# Patient Record
Sex: Female | Born: 2005
Health system: Southern US, Community
[De-identification: ages and names within clinical notes are randomized; demographics above are authoritative.]

## PROBLEM LIST (undated history)

## (undated) DIAGNOSIS — F909 Attention-deficit hyperactivity disorder, unspecified type: Secondary | ICD-10-CM

---
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
## 2024-01-16 ENCOUNTER — Other Ambulatory Visit (HOSPITAL_COMMUNITY): Payer: Self-pay

## 2024-01-17 ENCOUNTER — Emergency Department (HOSPITAL_COMMUNITY)
Admission: EM | Admit: 2024-01-17 | Discharge: 2024-01-18 | Disposition: A | Discharge status: Other Acute Inpatient Hospital | Payer: Commercial Managed Care - PPO | Attending: Emergency Medicine | Admitting: Emergency Medicine

## 2024-01-17 ENCOUNTER — Other Ambulatory Visit: Payer: Self-pay

## 2024-01-17 DIAGNOSIS — X789XXA Intentional self-harm by unspecified sharp object, initial encounter: Secondary | ICD-10-CM | POA: Diagnosis not present

## 2024-01-17 DIAGNOSIS — F901 Attention-deficit hyperactivity disorder, predominantly hyperactive type: Secondary | ICD-10-CM | POA: Diagnosis present

## 2024-01-17 DIAGNOSIS — S70312A Abrasion, left thigh, initial encounter: Secondary | ICD-10-CM | POA: Insufficient documentation

## 2024-01-17 DIAGNOSIS — F439 Reaction to severe stress, unspecified: Secondary | ICD-10-CM | POA: Diagnosis not present

## 2024-01-17 DIAGNOSIS — F332 Major depressive disorder, recurrent severe without psychotic features: Secondary | ICD-10-CM | POA: Diagnosis present

## 2024-01-17 DIAGNOSIS — S79922A Unspecified injury of left thigh, initial encounter: Secondary | ICD-10-CM | POA: Diagnosis present

## 2024-01-17 DIAGNOSIS — F909 Attention-deficit hyperactivity disorder, unspecified type: Secondary | ICD-10-CM | POA: Diagnosis not present

## 2024-01-17 LAB — CBC WITH DIFFERENTIAL/PLATELET
Abs Immature Granulocytes: 0.04 10*3/uL (ref 0.00–0.07)
Basophils Absolute: 0.1 10*3/uL (ref 0.0–0.1)
Basophils Relative: 1 %
Eosinophils Absolute: 0.1 10*3/uL (ref 0.0–0.5)
Eosinophils Relative: 1 %
HCT: 39.4 % (ref 36.0–46.0)
Hemoglobin: 12.9 g/dL (ref 12.0–15.0)
Immature Granulocytes: 0 %
Lymphocytes Relative: 30 %
Lymphs Abs: 3 10*3/uL (ref 0.7–4.0)
MCH: 29.9 pg (ref 26.0–34.0)
MCHC: 32.7 g/dL (ref 30.0–36.0)
MCV: 91.2 fL (ref 80.0–100.0)
Monocytes Absolute: 0.7 10*3/uL (ref 0.1–1.0)
Monocytes Relative: 7 %
Neutro Abs: 6.2 10*3/uL (ref 1.7–7.7)
Neutrophils Relative %: 61 %
Platelets: 337 10*3/uL (ref 150–400)
RBC: 4.32 MIL/uL (ref 3.87–5.11)
RDW: 12.6 % (ref 11.5–15.5)
WBC: 10.1 10*3/uL (ref 4.0–10.5)
nRBC: 0 % (ref 0.0–0.2)

## 2024-01-17 LAB — COMPREHENSIVE METABOLIC PANEL
ALT: 28 U/L (ref 0–44)
AST: 23 U/L (ref 15–41)
Albumin: 4.1 g/dL (ref 3.5–5.0)
Alkaline Phosphatase: 56 U/L (ref 38–126)
Anion gap: 10 (ref 5–15)
BUN: 14 mg/dL (ref 6–20)
CO2: 23 mmol/L (ref 22–32)
Calcium: 9.6 mg/dL (ref 8.9–10.3)
Chloride: 104 mmol/L (ref 98–111)
Creatinine, Ser: 0.64 mg/dL (ref 0.44–1.00)
GFR, Estimated: >60 mL/min (ref >60)
Glucose, Bld: 121 mg/dL — ABNORMAL HIGH (ref 70–99)
Potassium: 3.6 mmol/L (ref 3.5–5.1)
Sodium: 137 mmol/L (ref 135–145)
Total Bilirubin: 0.5 mg/dL (ref 0.0–1.2)
Total Protein: 7.6 g/dL (ref 6.5–8.1)

## 2024-01-17 LAB — RAPID URINE DRUG SCREEN, HOSP PERFORMED
Amphetamines: NOT DETECTED
Barbiturates: NOT DETECTED
Benzodiazepines: NOT DETECTED
Cocaine: NOT DETECTED
Opiates: NOT DETECTED
Tetrahydrocannabinol: NOT DETECTED

## 2024-01-17 LAB — ETHANOL: Alcohol, Ethyl (B): <10 mg/dL (ref <10)

## 2024-01-17 LAB — HCG, SERUM, QUALITATIVE: Preg, Serum: NEGATIVE

## 2024-01-17 NOTE — ED Provider Notes (Signed)
[place] EMERGENCY DEPARTMENT AT [facility] Provider Note   CSN: [id number] Arrival date & time: 01/17/24  1535     History {Add pertinent medical, surgical, social history, OB history to HPI:1} Chief Complaint  Patient presents with   Psychiatric Evaluation    Betty Davis is a 18 y.o. female.  HPI    18yo female with history of major depressive disorder    Past Medical History:  Diagnosis Date   ADHD (attention deficit hyperactivity disorder)      Home Medications Prior to Admission medications   Medication Sig Start Date End Date Taking? Authorizing Provider  ARIPiprazole (ABILIFY) 2 MG tablet Take 1 tablet by mouth at bedtime for 7 days, then increase to 2 tablets after initial 7 days based on symptoms for mood 03/05/22     atomoxetine (STRATTERA) 18 MG capsule Take 1 capsule (18 mg total) by mouth in the morning. 02/05/23     atomoxetine (STRATTERA) 40 MG capsule Take 1 capsule (40 mg total) by mouth every morning as needed. 09/02/23     atomoxetine (STRATTERA) 80 MG capsule Take 1 capsule (80 mg total) by mouth in the morning. 11/27/23     buPROPion (WELLBUTRIN XL) 150 MG 24 hr tablet Take 1 tablet (150 mg total) by mouth daily. 10/05/21   Leata Mouse, MD  cloNIDine (CATAPRES) 0.1 MG tablet Take 1 tablet (0.1 mg total) by mouth at bedtime. 10/24/22     fluticasone (FLONASE SENSIMIST) 27.5 MCG/SPRAY nasal spray Place 1 spray into nostril twice a day 04/07/22     fluticasone (FLONASE) 50 MCG/ACT nasal spray Place 1 spray into both nostrils daily as needed for allergies. 05/12/18   [provider]  fluticasone (FLONASE) 50 MCG/ACT nasal spray Place 1 spray into each nostril 2 times a day 04/29/22     fluticasone (FLONASE) 50 MCG/ACT nasal spray Place 1 spray into both nostrils daily. 06/29/23     fluticasone (FLONASE) 50 MCG/ACT nasal spray Place 1 spray into both nostrils 2 (two) times daily. 11/09/23     hydrOXYzine (ATARAX) 10 MG tablet Take 1  tablet by mouth 2 times daily as needed for anxiety/insomnia Patient taking differently: Take 10 mg by mouth 3 (three) times daily as needed for anxiety. 10/04/21   Leata Mouse, MD  hydrOXYzine (ATARAX) 10 MG tablet Take 1 tablet up to 3 times a day as needed for anxiety. 05/06/22     hydrOXYzine (ATARAX) 10 MG tablet Take 1 tablet by mouth up to 2 times a day as needed anxiety 08/13/22     hydrOXYzine (ATARAX) 10 MG tablet Take 1 tablet (10 mg total) by mouth 1-2 times daily as needed. 05/27/23     hydrOXYzine (ATARAX) 10 MG tablet Take 1 tablet (10 mg total) by mouth one to two times daily as needed. 11/17/23     ibuprofen (ADVIL) 200 MG tablet Take 200 mg by mouth every 6 (six) hours as needed for headache.    [provider]  Lisdexamfetamine Dimesylate 20 MG CHEW Chew 1 tablet by mouth daily Patient taking differently: Chew 20 mg by mouth See admin instructions. Chew one tablet (20 mg) by mouth in the morning on school days 01/24/22     mirtazapine (REMERON) 30 MG tablet Take 1 tablet (30 mg total) by mouth at bedtime. 09/25/23     nitrofurantoin, macrocrystal-monohydrate, (MACROBID) 100 MG capsule Take 1 capsule by mouth with food every 12 hours for 7 days 08/27/22     Nitroglycerin (  RECTIV) 0.4 % OINT Apply rectally 2 times a day for 56 days 12/11/22     norethindrone-ethinyl estradiol-FE (LOESTRIN FE) 1-20 MG-MCG tablet Take 1 tablet by mouth daily. 08/07/23     Norethindrone-Ethinyl Estradiol-Fe Biphas (LO LOESTRIN FE) 1 MG-10 MCG / 10 MCG tablet Take 1 tablet by mouth daily. 09/05/22     Norethindrone-Ethinyl Estradiol-Fe Biphas (LO LOESTRIN FE) 1 MG-10 MCG / 10 MCG tablet Take 1 tablet by mouth daily. 09/11/22     Olopatadine HCl 0.2 % SOLN Apply 1 drop to affected eye(s) daily. 02/21/22   Maretta Bees, PA      Allergies    Orange peel extract Erskine Emery oil]    Review of Systems   Review of Systems  Physical Exam Updated Vital Signs BP 127/75 (BP Location: Left Arm)    Pulse 96   Temp 98.5 F (36.9 C) (Oral)   Resp 15   Ht 5\' 4"  (1.626 m)   Wt 54.4 kg   LMP 01/10/2024   SpO2 100%   BMI 20.60 kg/m  Physical Exam  ED Results / Procedures / Treatments   Labs (all labs ordered are listed, but only abnormal results are displayed) Labs Reviewed - No data to display  EKG None  Radiology No results found.  Procedures Procedures  {Document cardiac monitor, telemetry assessment procedure when appropriate:1}  Medications Ordered in ED Medications - No data to display  ED Course/ Medical Decision Making/ A&P   {   Click here for ABCD2, HEART and other calculatorsREFRESH Note before signing :1}                              Medical Decision Making  ***  {Document critical care time when appropriate:1} {Document review of labs and clinical decision tools ie heart score, Chads2Vasc2 etc:1}  {Document your independent review of radiology images, and any outside records:1} {Document your discussion with family members, caretakers, and with consultants:1} {Document social determinants of health affecting pt's care:1} {Document your decision making why or why not admission, treatments were needed:1} Final Clinical Impression(s) / ED Diagnoses Final diagnoses:  None    Rx / DC Orders ED Discharge Orders     None

## 2024-01-17 NOTE — BH Assessment (Incomplete)
Betty Davis is an 18 year old female presenting voluntary to [facility] due to SI and self-harming behaviors of cutting herself 2 days ago. Patient reports history of anxiety and depression. Patient denied SI, HI, psychosis and alcohol/drug usage.   Patient reports SI with no plan. Patient reports cutting herself 2 days ago because she and her boyfriend broke, stating "no one was there for me". Patient reports main stressors also includes mostly school, stating "I am stressed out with mostly school, I am a senior, stressed out about college and growing up". Patient reports this was the first time she has cut herself. Patient reports worsening depressive symptoms. Patient reports 7-8 hours nightly sleep and normal appetite.   Patient is currently seeing a therapist, Duard Brady, every other week for the past 3 years. Patient stopped taking anxiety medications on last week, stating "it was messing with my appetite". Patient reports only taking ADHD medications. Patient reported last psych hospitalization was 03/2022 due to SI with self-harming behaviors of burning herself with a lighter. Patient denied history of suicide attempts.   Patient resides with mother and father. Patient is currently in the 12th grade at [employer]. Patient reports currently making As and Bs. Patient reports she was bullied during her 9th and 10th grade year and had to switch schools 3x due to being bullied. Patient reports at her current school she is not being bullied. Patient denied access to guns. Patient was calm and cooperative during assessment.

## 2024-01-17 NOTE — ED Triage Notes (Signed)
Patient to ED by POV for psych eval. Patient states she has self harmed by cutting her leg. She does report SI by cutting herself or burning. Life events and school are the cause for SI.

## 2024-01-18 ENCOUNTER — Inpatient Hospital Stay (HOSPITAL_COMMUNITY)
Admission: AD | Admit: 2024-01-18 | Discharge: 2024-01-22 | DRG: 885 | Disposition: A | Discharge status: Home / Self Care | Payer: Commercial Managed Care - PPO | Source: Other Acute Inpatient Hospital | Attending: Psychiatry | Admitting: Psychiatry

## 2024-01-18 ENCOUNTER — Other Ambulatory Visit (HOSPITAL_COMMUNITY): Payer: Self-pay

## 2024-01-18 ENCOUNTER — Encounter (HOSPITAL_COMMUNITY): Payer: Self-pay | Admitting: Adult Health

## 2024-01-18 DIAGNOSIS — Z91018 Allergy to other foods: Secondary | ICD-10-CM | POA: Diagnosis not present

## 2024-01-18 DIAGNOSIS — Z818 Family history of other mental and behavioral disorders: Secondary | ICD-10-CM

## 2024-01-18 DIAGNOSIS — Z803 Family history of malignant neoplasm of breast: Secondary | ICD-10-CM | POA: Diagnosis not present

## 2024-01-18 DIAGNOSIS — Z9152 Personal history of nonsuicidal self-harm: Secondary | ICD-10-CM | POA: Diagnosis not present

## 2024-01-18 DIAGNOSIS — Z62810 Personal history of physical and sexual abuse in childhood: Secondary | ICD-10-CM

## 2024-01-18 DIAGNOSIS — F909 Attention-deficit hyperactivity disorder, unspecified type: Secondary | ICD-10-CM | POA: Diagnosis not present

## 2024-01-18 DIAGNOSIS — Z814 Family history of other substance abuse and dependence: Secondary | ICD-10-CM

## 2024-01-18 DIAGNOSIS — Z79899 Other long term (current) drug therapy: Secondary | ICD-10-CM | POA: Diagnosis not present

## 2024-01-18 DIAGNOSIS — J45909 Unspecified asthma, uncomplicated: Secondary | ICD-10-CM | POA: Diagnosis present

## 2024-01-18 DIAGNOSIS — Z23 Encounter for immunization: Secondary | ICD-10-CM

## 2024-01-18 DIAGNOSIS — Z560 Unemployment, unspecified: Secondary | ICD-10-CM

## 2024-01-18 DIAGNOSIS — F411 Generalized anxiety disorder: Secondary | ICD-10-CM | POA: Diagnosis present

## 2024-01-18 DIAGNOSIS — Z91138 Patient's unintentional underdosing of medication regimen for other reason: Secondary | ICD-10-CM

## 2024-01-18 DIAGNOSIS — Z634 Disappearance and death of family member: Secondary | ICD-10-CM

## 2024-01-18 DIAGNOSIS — F332 Major depressive disorder, recurrent severe without psychotic features: Principal | ICD-10-CM | POA: Diagnosis present

## 2024-01-18 DIAGNOSIS — Z800 Family history of malignant neoplasm of digestive organs: Secondary | ICD-10-CM

## 2024-01-18 DIAGNOSIS — Z91148 Patient's other noncompliance with medication regimen for other reason: Secondary | ICD-10-CM

## 2024-01-18 DIAGNOSIS — F901 Attention-deficit hyperactivity disorder, predominantly hyperactive type: Secondary | ICD-10-CM | POA: Diagnosis present

## 2024-01-18 DIAGNOSIS — Z811 Family history of alcohol abuse and dependence: Secondary | ICD-10-CM

## 2024-01-18 DIAGNOSIS — Z62811 Personal history of psychological abuse in childhood: Secondary | ICD-10-CM | POA: Diagnosis not present

## 2024-01-18 DIAGNOSIS — Z8659 Personal history of other mental and behavioral disorders: Secondary | ICD-10-CM

## 2024-01-18 DIAGNOSIS — Z9189 Other specified personal risk factors, not elsewhere classified: Secondary | ICD-10-CM

## 2024-01-18 DIAGNOSIS — F439 Reaction to severe stress, unspecified: Secondary | ICD-10-CM | POA: Diagnosis not present

## 2024-01-18 DIAGNOSIS — S70312A Abrasion, left thigh, initial encounter: Secondary | ICD-10-CM | POA: Diagnosis not present

## 2024-01-18 MED ORDER — NON FORMULARY
Freq: Every morning | Status: DC
Start: 2024-01-19 — End: 2024-01-18

## 2024-01-18 MED ORDER — NORETHIN ACE-ETH ESTRAD-FE 1-20 MG-MCG PO TABS
1.0000 | ORAL_TABLET | Freq: Every day | ORAL | 1 refills | Status: DC
  Filled 2024-01-18 – 2024-04-25 (×6): qty 84, 84d supply, fill #0
  Filled 2024-07-07 – 2024-07-11 (×2): qty 84, 84d supply, fill #1

## 2024-01-18 MED ORDER — NORETHIN ACE-ETH ESTRAD-FE 1-20 MG-MCG PO TABS: 1.0000 | ORAL_TABLET | Freq: Every day | ORAL | Status: DC

## 2024-01-18 MED ORDER — FLUTICASONE PROPIONATE 50 MCG/ACT NA SUSP
1.0000 | Freq: Two times a day (BID) | NASAL | Status: DC
  Administered 2024-01-18: 1 via NASAL
  Filled 2024-01-18: qty 16

## 2024-01-18 MED ORDER — NORETHIN ACE-ETH ESTRAD-FE 1-20 MG-MCG PO TABS
1.0000 | ORAL_TABLET | Freq: Every day | ORAL | Status: DC
  Administered 2024-01-18: 1 via ORAL

## 2024-01-18 MED ORDER — DIPHENHYDRAMINE HCL 50 MG/ML IJ SOLN: 50.0000 mg | Freq: Three times a day (TID) | INTRAMUSCULAR | Status: DC | PRN

## 2024-01-18 MED ORDER — MIRTAZAPINE 30 MG PO TABS
30.0000 mg | ORAL_TABLET | Freq: Every day | ORAL | Status: DC
  Administered 2024-01-18 – 2024-01-21 (×3): 30 mg via ORAL
  Filled 2024-01-18 (×6): qty 1
  Filled 2024-01-18: qty 2
  Filled 2024-01-18: qty 1

## 2024-01-18 MED ORDER — NORETHIN ACE-ETH ESTRAD-FE 1-20 MG-MCG PO TABS
1.0000 | ORAL_TABLET | Freq: Every day | ORAL | Status: DC
  Administered 2024-01-19 – 2024-01-22 (×4): 1 via ORAL

## 2024-01-18 MED ORDER — INFLUENZA VIRUS VACC SPLIT PF (FLUZONE) 0.5 ML IM SUSY
0.5000 mL | PREFILLED_SYRINGE | INTRAMUSCULAR | Status: AC
  Administered 2024-01-20: 0.5 mL via INTRAMUSCULAR
  Filled 2024-01-18: qty 0.5

## 2024-01-18 MED ORDER — HYDROXYZINE HCL 10 MG PO TABS: 10.0000 mg | ORAL_TABLET | Freq: Two times a day (BID) | ORAL | Status: DC | PRN

## 2024-01-18 MED ORDER — MIRTAZAPINE 30 MG PO TABS: 30.0000 mg | ORAL_TABLET | Freq: Every day | ORAL | Status: DC

## 2024-01-18 MED ORDER — ALUM & MAG HYDROXIDE-SIMETH 200-200-20 MG/5ML PO SUSP: 30.0000 mL | Freq: Four times a day (QID) | ORAL | Status: DC | PRN

## 2024-01-18 MED ORDER — FLUTICASONE PROPIONATE 50 MCG/ACT NA SUSP
1.0000 | Freq: Two times a day (BID) | NASAL | Status: DC
  Administered 2024-01-18 – 2024-01-22 (×8): 1 via NASAL
  Filled 2024-01-18 (×2): qty 16

## 2024-01-18 MED ORDER — ATOMOXETINE HCL 40 MG PO CAPS
80.0000 mg | ORAL_CAPSULE | Freq: Every day | ORAL | Status: DC
  Administered 2024-01-19 – 2024-01-22 (×4): 80 mg via ORAL
  Filled 2024-01-18 (×9): qty 2

## 2024-01-18 MED ORDER — HYDROXYZINE HCL 10 MG PO TABS
10.0000 mg | ORAL_TABLET | Freq: Two times a day (BID) | ORAL | Status: DC | PRN
  Administered 2024-01-21: 10 mg via ORAL
  Filled 2024-01-18: qty 1

## 2024-01-18 MED ORDER — MAGNESIUM HYDROXIDE 400 MG/5ML PO SUSP: 30.0000 mL | Freq: Every evening | ORAL | Status: DC | PRN

## 2024-01-18 MED ORDER — ATOMOXETINE HCL 40 MG PO CAPS
80.0000 mg | ORAL_CAPSULE | Freq: Every day | ORAL | Status: DC
  Administered 2024-01-18: 80 mg via ORAL
  Filled 2024-01-18: qty 2

## 2024-01-18 NOTE — ED Notes (Signed)
Attempted to call [facility] and was told that RN would call me back.

## 2024-01-18 NOTE — Consult Note (Signed)
[facility] Psychiatric Consult Initial  Patient Name: .Betty Davis  MRN: [id number]  DOB: [birth year]  Consult Order details:  Orders (From admission, onward)     Start     Ordered   01/17/24 1640  CONSULT TO CALL ACT TEAM       Ordering Provider: Alvira Monday, MD  Provider:  (Not yet assigned)  Question:  Reason for Consult?  Answer:  Psych consult   01/17/24 1640             Mode of Visit: Tele-visit Virtual Statement:TELE PSYCHIATRY ATTESTATION & CONSENT As the provider for this telehealth consult, I attest that I verified the patient's identity using two separate identifiers, introduced myself to the patient, provided my credentials, disclosed my location, and performed this encounter via a HIPAA-compliant, real-time, face-to-face, two-way, interactive audio and video platform and with the full consent and agreement of the patient (or guardian as applicable.) Patient physical location: [facility]. Telehealth provider physical location: home office in state of [place].   Video start time: 1000 Video end time: 1056    Psychiatry Consult Evaluation  Service Date: January 18, 2024 LOS:  LOS: 0 days  Chief Complaint "I've got a lot of stress right now."  Primary Psychiatric Diagnoses   1.  Major Depressive Disorder, recurrent,  2.  ADHD 3.  Aggressive Behavior   Assessment  Betty Davis is a 18 y.o. female admitted: Presented to the EDfor 01/17/2024  3:42 PM for suicidal ideations, new onset of cutting behaviors triggered by personal loss (recently broke up with the boyfriend) and psychosocial stressors.  She carries the psychiatric diagnoses of Major Depressive Disorder, recurrent, severe without psychosis, ADHD, hyperactive-impulsive type;   Her current presentation of anxiousness, sadness, feeling overwhelmed, self injurious behavior and socially isolating is most consistent with Major Depressive Disorder. She meets criteria for psychiatric admission based on above.  Current  outpatient psychotropic medications include mirtazapine 30mg  po at bedtime; atomoxetine 80mg  po daily; hydroxyzine 10mg  po BID prn anxiety  and historically she has had a good response to these medications. She was compliant with medications prior to admission as evidenced by patient self reports.   On initial examination, patient is alert and oriented x4, she's cooperative and able to appropriately articulate her concerns that led to admission.  She acknowledges, this is the first time she's engaged in cutting herself and relates the self injurious behaviors does correlate with her mounting stress.  She reports SI but does not report plan or intent to end her life.  However, given her personal loss,impulsiveness, difficulty employing coping skills, and social isolation, she is at increased risk for self harm.  Above was discussed with patient who voluntarily accepts admission.     Please see plan below for detailed recommendations.   Diagnoses:  Active Hospital problems: Principal Problem:   MDD (major depressive disorder), recurrent severe, without psychosis (HCC) Active Problems:   ADHD, hyperactive-impulsive type    Plan   ## Psychiatric Medication Recommendations:  Her home medications have already been restarted.  She can continues meds as follows: mirtazapine 30mg  po at bedtime; atomoxetine 80mg  po daily; hydroxyzine 10mg  po BID prn anxiety   ## Medical Decision Making Capacity:  Patient is 19 and able to make decisions for herself.   ## Further Work-up:  -- Baseline EKG to evaluate for prolonged QTC intervals EKG -- Pertinent labwork reviewed earlier this admission includes: CMP, CBC, UDS, Pregnancy Test   ## Disposition:-- We recommend inpatient psychiatric hospitalization when  medically cleared. Patient is under voluntary admission status at this time; please IVC if attempts to leave hospital.  ## Behavioral / Environmental: -Recommend using specific terminology regarding  PNES, i.e. call the episodes "non-epileptic seizures" rather than "pseudoseizures" as the latter insinuates "fake" or "feigned" symptoms, when the events are a very real experience to the patient and are a physical, non-volitional, manifestation of fear, pain and anxiety. , To minimize splitting of staff, assign one staff person to communicate all information from the team when feasible., or Utilize compassion and acknowledge the patient's experiences while setting clear and realistic expectations for care.    ## Safety and Observation Level:  - Based on my clinical evaluation, I estimate the patient to be at low risk of self harm in the current setting. - At this time, we recommend  routine. This decision is based on my review of the chart including patient's history and current presentation, interview of the patient, mental status examination, and consideration of suicide risk including evaluating suicidal ideation, plan, intent, suicidal or self-harm behaviors, risk factors, and protective factors. This judgment is based on our ability to directly address suicide risk, implement suicide prevention strategies, and develop a safety plan while the patient is in the clinical setting. Please contact our team if there is a concern that risk level has changed.  CSSR Risk Category:C-SSRS RISK CATEGORY: High Risk  Suicide Risk Assessment: Patient has following modifiable risk factors for suicide: impulsive behaviors, isolating behaviors, which we are addressing by referral for psychiatric admission for crisis stabilization and monitoring. Patient has following non-modifiable or demographic risk factors for suicide: history of self harm behavior and psychiatric hospitalization Patient has the following protective factors against suicide: Access to outpatient mental health care and Supportive family  Thank you for this consult request. Recommendations have been communicated to the primary team.  We will refer  for psychiatric admission at this time.   Chales Abrahams, NP       History of Present Illness  Relevant Aspects of Hospital ED Course:  Admitted on 01/17/2024 for evaluation of worsening depression and thoughts of self harm.    Patient Report:  Patient is a private exam room in hospital, laying on hospital gurney.  She's greeted and given anticipatory guidance and agrees to continue.  When asked how she's doing today she reports, "I'm a ball of nerves." She reiterates most of what was already captured in TTS admission assessment.  Additionally, she reports a hx prior self injurious behaviors by burning herself related to being bullied at school.  However she reports this time is different and this is the first time she's been stressed enough to actually cut herself and it's related to an adjustment period.  She reports 2 days prior to admission, she cut her left thigh, " She denies suicide attempt. I just wanted to deal with the pain."  She reports she's been using topical abx cream and the area has already began to heal up; barely visible today.   She states her mother was able to get her accepted to a residential facility but she cannot recall the name. Patient states she was told she has to get "stabilized" prior to admission.  She shares today her desire to get well.    Psych ROS:  Depression: yes, past and present, endorses feelings of sadness, low motivation, isolating behaviors Anxiety:  yes, past and present Mania (lifetime and current): denies Psychosis: (lifetime and current): denies  Collateral information:  Contacted mother  Leafy Ro Pender-Batten, [phone] @1045on  01/18/2024; She reiterates information she provided to TTS counselor.  Patient's mother states patient was accepted to [facility] but d/t recent cutting, the facility wanted her suicidal ideations and cutting behaviors evaluated and or managed.  We discussed referral for inpatient admission and acceptance to [facility].   Pt's mother reports patient did not have a good experience during her last hospitalization at [facility]. She asks about other inpatient facilities her daughter can transfer to but then states she will allow patient to make the decision.   Review of Systems  Constitutional: Negative.   HENT: Negative.    Eyes: Negative.   Respiratory: Negative.    Cardiovascular: Negative.   Gastrointestinal: Negative.   Genitourinary: Negative.   Musculoskeletal: Negative.   Skin: Negative.   Neurological: Negative.   Endo/Heme/Allergies: Negative.   Psychiatric/Behavioral:  Positive for depression and suicidal ideas. Negative for memory loss. The patient is nervous/anxious. The patient does not have insomnia.      Psychiatric and Social History  Psychiatric History:  Information collected from patient, chart review and her mother  Prev Dx/Sx: MDD, ADHD, behavior problem in child Current Psych Provider: deferred Home Meds (current): as outlined above Previous Med Trials: patient unsure of names or previous meds but recalls she stopped d/t decreased appetite Therapy: yes, enrolled in outpatient therapy, has been seeing Dominique Hickman, every other week for the past 3 years.   Prior Psych Hospitalization: yes, several within the past 2 years for depression and SI, include [facility]. Most recent was 03/2022 for SI and burning herself with lighter. Prior Self Harm: yes, hx of cutting and burning herself with a lighter. Prior Violence: one year prior she was brought in under IVC for aggressive behaviors.    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, commissioning Hx: denies Living Situation: lives at home with her mother and father Access to weapons/lethal means: denies   Substance History Alcohol: denies  Tobacco: denies Illicit drugs: denies  Exam Findings   Physical Exam: as outlined below Vital Signs:  Temp:  [98.1 F (36.7 C)-98.6 F (37 C)] 98.1 F (36.7 C) (01/27 1146) Pulse Rate:  [96-106] 106 (01/27 1146) Resp:  [15-18] 16 (01/27 1146) BP: (120-127)/(65-75) 120/65 (01/27 1146) SpO2:  [100 %] 100 % (01/27 1146) Weight:  [54.4 kg] 54.4 kg (01/26 1548) Blood pressure 120/65, pulse (!) 106, temperature 98.1 F (36.7 C), temperature source Oral, resp. rate 16, height 5\' 4"  (1.626 m), weight 54.4 kg, last menstrual period 01/10/2024, SpO2 100%. Body mass index is 20.6 kg/m.  Physical Exam Constitutional:      Appearance: Normal appearance.  Cardiovascular:     Rate and Rhythm: Normal rate.     Pulses: Normal pulses.  Pulmonary:     Effort: Pulmonary effort is normal.  Musculoskeletal:        General: Normal range of motion.     Cervical back: Normal range of motion.  Neurological:     Mental Status: She is alert and oriented to person, place, and time. Mental status is at baseline.  Psychiatric:        Attention and Perception: Attention and perception normal.        Mood and Affect: Mood is anxious and depressed.    Mental Status Exam: General Appearance: Casual, Neat, and Well Groomed  Orientation:  Full (Time,  Place, and Person)  Memory:  Immediate;   Good Recent;   Good Remote;   Good  Concentration:  Concentration: Fair and Attention Span: Fair  Recall:  Good  Attention  Fair  Eye Contact:  Fair  Speech:   very talkative but  not pressured  Language:  Good  Volume:  Normal  Mood: "I'm okay"  Affect:  Blunt, Non-Congruent, and Depressed  Thought Process:  Coherent and Descriptions of Associations: Intact  Thought Content:  Logical and Rumination  Suicidal Thoughts:  Yes.  without intent/plan  Homicidal Thoughts:  No  Judgement:  Impaired  Insight:  Fair  Psychomotor Activity:  Normal  Akathisia:  No  Fund of Knowledge:  Good      Assets:  Communication Skills Desire for Improvement Financial  Resources/Insurance Housing Physical Health Social Support  Cognition:  WNL  ADL's:  Intact  AIMS (if indicated):        Other History   These have been pulled in through the EMR, reviewed, and updated if appropriate.  Family History:  The patient's family history includes Cancer in her maternal grandmother; Drug abuse in her paternal aunt and paternal uncle; Healthy in her mother; Suicidality in her paternal aunt and paternal uncle.  Medical History: Past Medical History:  Diagnosis Date   ADHD (attention deficit hyperactivity disorder)     Surgical History: No past surgical history on file.   Medications:  No current facility-administered medications for this encounter. No current outpatient medications on file.  Facility-Administered Medications Ordered in Other Encounters:    alum & mag hydroxide-simeth (MAALOX/MYLANTA) 200-200-20 MG/5ML suspension 30 mL, 30 mL, Oral, Q6H PRN, Chales Abrahams, NP   [START ON 01/19/2024] atomoxetine (STRATTERA) capsule 80 mg, 80 mg, Oral, Daily, Ophelia Shoulder E, NP   diphenhydrAMINE (BENADRYL) injection 50 mg, 50 mg, Intramuscular, TID PRN, Chales Abrahams, NP   fluticasone (FLONASE) 50 MCG/ACT nasal spray 1 spray, 1 spray, Each Nare, BID, Ophelia Shoulder E, NP   hydrOXYzine (ATARAX) tablet 10 mg, 10 mg, Oral, BID PRN, Ophelia Shoulder E, NP   magnesium hydroxide (MILK OF MAGNESIA) suspension 30 mL, 30 mL, Oral, QHS PRN, Ophelia Shoulder E, NP   mirtazapine (REMERON) tablet 30 mg, 30 mg, Oral, QHS, Ophelia Shoulder E, NP   [START ON 01/19/2024] norethindrone-ethinyl estradiol-FE (LOESTRIN FE) 1-20 MG-MCG per tablet 1 tablet, 1 tablet, Oral, Daily, Chales Abrahams, NP  Allergies: Allergies  Allergen Reactions   Orange Peel Extract [Orange Oil] Rash    Reaction to orange peel    Chales Abrahams, NP

## 2024-01-18 NOTE — Progress Notes (Signed)
Patient ID: Betty Davis, female   DOB: 09-17-2006, 18 y.o.   MRN: [id number]   Initial Treatment Plan 01/18/2024 8:28 PM Allan Bacigalupi [id number]    PATIENT STRESSORS: Educational concerns   Medication change or noncompliance     PATIENT STRENGTHS: Motivation for treatment/growth  Supportive family/friends    PATIENT IDENTIFIED PROBLEMS: Suicidal ideation  Depression  Anxiety                 DISCHARGE CRITERIA:  Improved stabilization in mood, thinking, and/or behavior Reduction of life-threatening or endangering symptoms to within safe limits Verbal commitment to aftercare and medication compliance  PRELIMINARY DISCHARGE PLAN: Outpatient therapy Return to previous living arrangement Return to previous work or school arrangements  PATIENT/FAMILY INVOLVEMENT: This treatment plan has been presented to and reviewed with the patient, Betty Davis, and/or family member.  The patient and family have been given the opportunity to ask questions and make suggestions.  Tania Ade, RN 01/18/2024, 8:28 PM

## 2024-01-18 NOTE — Progress Notes (Signed)
Pt rates depression 0/10 and anxiety 0/10. Pt appears anxious. Pt reports a good appetite, and no physical problems. Pt denies SI/HI/AVH and verbally contracts for safety. Provided support and encouragement. Pt safe on the unit. Q 15 minute safety checks continued.

## 2024-01-18 NOTE — Progress Notes (Signed)
Patient ID: Betty Davis, female   DOB: 20-May-2006, 18 y.o.   MRN: [id number]   Pt alert and oriented during [facility] admission. Pt denies SI/HI, AVH, and any pain. Pt is teary-eyed and cooperative. Education, support, reassurance, and encouragement provided, q15 minute safety checks initiated. Pt denies any concerns at this time, and verbally contracts for safety. Pt ambulating on the unit with no issues. Pt remains safe on the unit,

## 2024-01-18 NOTE — Progress Notes (Signed)
Pt has been accepted to [facility] on 01/18/2024 Bed assignment: 201-1  Pt meets inpatient criteria per: Ophelia Shoulder NP  Attending Physician will be:  Cyndia Skeeters, MD    Report can be called [id number] and Adolescence unit: [phone]   Pt can arrive after pending to C/A .   Care Team Notified: [facility] Rona Ravens RN, Charlsie Merles RN, Ophelia Shoulder NP Berton Lan RN, Dossie Arbour RN   [place] Cici Rodriges LCSW-A   01/18/2024 10:55 AM

## 2024-01-18 NOTE — ED Notes (Signed)
Pt has been accepted to [facility] on 01/18/2024 Bed assignment: 201-1  Pt meets inpatient criteria per: Ophelia Shoulder NP  Attending Physician will be: Cyndia Skeeters, MD  Report can be called [id number] and Adolescence unit: [phone]   Pt can arrive after pending to C/A .   Care Team Notified: [facility] Rona Ravens RN, Charlsie Merles RN, Ophelia Shoulder NP Berton Lan RN, Dossie Arbour RN   [place] LCSW-A

## 2024-01-18 NOTE — ED Notes (Signed)
Safe Transport requested for transport.

## 2024-01-18 NOTE — ED Notes (Signed)
Report given to [facility]

## 2024-01-19 DIAGNOSIS — F332 Major depressive disorder, recurrent severe without psychotic features: Secondary | ICD-10-CM | POA: Diagnosis not present

## 2024-01-19 NOTE — [facility] Group Notes (Signed)
Child/Adolescent Psychoeducational Group Note  Date:  01/19/2024 Time:  9:04 PM  Group Topic/Focus:  Wrap-Up Group:   The focus of this group is to help patients review their daily goal of treatment and discuss progress on daily workbooks.  Participation Level:  Active  Participation Quality:  Appropriate  Affect:  Appropriate  Cognitive:  Appropriate  Insight:  Appropriate  Engagement in Group:  Engaged  Modes of Intervention:  Discussion and Support  Additional Comments:  Pt told that today was a good day on the unit, the highlight of which was a visit from her Mom. "We played cards and I won." Pt told that her daily goal was to talking through her feelings with staff and peers, which she did. Pt rated her day a 6 out of 10.  Christ Kick 01/19/2024, 9:04 PM

## 2024-01-19 NOTE — Group Note (Signed)
Recreation Therapy Group Note   Group Topic:Animal Assisted Therapy   Group Date: 01/19/2024 Start Time: 1040 End Time: 1130 Facilitators: Marionette Meskill-McCall, LRT,CTRS Location: [address] Dayroom   Animal-Assisted Therapy (AAT) Program Checklist/Progress Notes Patient Eligibility Criteria Checklist & Daily Group note for Rec Tx Intervention  AAA/T Program Assumption of Risk Form signed by Patient/ or Parent Legal Guardian YES  Patient is free of allergies or severe asthma  YES  Patient reports no fear of animals YES  Patient reports no history of cruelty to animals YES  Patient understands their participation is voluntary YES  Patient washes hands before animal contact YES  Patient washes hands after animal contact YES  Goal Area(s) Addresses:  Patient will demonstrate appropriate social skills during group session.  Patient will demonstrate ability to follow instructions during group session.  Patient will identify reduction in anxiety level due to participation in animal assisted therapy session.    Education: Communication, Charity fundraiser, Health visitor   Education Outcome: Acknowledges education/In group clarification offered/Needs additional education.    Affect/Mood: Appropriate   Participation Level: Engaged   Participation Quality: Independent   Behavior: Appropriate   Speech/Thought Process: Focused   Insight: Good   Judgement: Good   Modes of Intervention: Teaching laboratory technician   Patient Response to Interventions:  Engaged   Education Outcome:  In group clarification offered    Clinical Observations/Individualized Feedback: Pt was bright and engaged. Pt asked questions. Pt also offered stories about her dog and how she and her family got him. Pt was also openly engaged in conversation with therapy dog volunteer and peers. Pt was appropriate throughout group session.     Plan: Continue to engage patient in RT group sessions  2-3x/week.   Betty Davis, LRT,CTRS 01/19/2024 1:06 PM

## 2024-01-19 NOTE — Group Note (Signed)
Occupational Therapy Group Note  Group Topic:Other  Group Date: 01/19/2024 Start Time: 1430 End Time: 1500 Facilitators: Ted Mcalpine, OT    The objective of this presentation is to provide a comprehensive understanding of the concept of "motivation" and its role in human behavior and well-being. The content covers various theories of motivation, including intrinsic and extrinsic motivators, and explores the psychological mechanisms that drive individuals to achieve goals, overcome obstacles, and make decisions. By diving into real-world applications, the presentation aims to offer actionable strategies for enhancing motivation in different life domains, such as work, relationships, and personal growth. Utilizing a multi-disciplinary approach, this presentation integrates insights from psychology, neuroscience, and behavioral economics to present a holistic view of motivation. The objective is not only to educate the audience about the complexities and driving forces behind motivation but also to equip them with practical tools and techniques to improve their own motivation levels. By the end of the presentation, attendees should have a well-rounded understanding of what motivates human actions and how to harness this knowledge for personal and professional betterment.  Kerrin Champagne, OT     Participation Level: Engaged   Participation Quality: Independent   Behavior: Appropriate   Speech/Thought Process: Relevant   Affect/Mood: Appropriate   Insight: Good   Judgement: Fair      Modes of Intervention: Education  Patient Response to Interventions:  Attentive   Plan: Continue to engage patient in OT groups 2 - 3x/week.  01/19/2024  Ted Mcalpine, OT  Kerrin Champagne, OT
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
## 2024-01-19 NOTE — Plan of Care (Signed)
Problem: Coping: Goal: Ability to verbalize frustrations and anger appropriately will improve Outcome: Progressing   Problem: Safety: Goal: Periods of time without injury will increase Outcome: Progressing   Problem: Coping: Goal: Coping ability will improve Outcome: Progressing

## 2024-01-19 NOTE — Progress Notes (Signed)
12:14 PM - CSW spoke with pt's mother, Chantea Surace [phone], via phone call regarding pt status. Per mother, pt has been accepted to [facility] in [place] for residential treatment. Per mother, pt will be transitioning to residential treatment upon discharge. CSW will continue to assist and follow.  Cathie Beams, MSW, LCSW 01/19/2024 1:08 PM

## 2024-01-19 NOTE — BHH Group Notes (Signed)
Type of Therapy:  Group Topic/ Focus: Goals Group: The focus of this group is to help patients establish daily goals to achieve during treatment and discuss how the patient can incorporate goal setting into their daily lives to aide in recovery.    Participation Level:  Active   Participation Quality:  Appropriate   Affect:  Appropriate   Cognitive:  Appropriate   Insight:  Appropriate   Engagement in Group:  Engaged   Modes of Intervention:  Discussion   Summary of Progress/Problems:   Patient attended and participated goals group today. No SI/HI. Patient's goal for today is to leave and go to a day facility.

## 2024-01-20 ENCOUNTER — Encounter (HOSPITAL_COMMUNITY): Payer: Self-pay

## 2024-01-20 DIAGNOSIS — F332 Major depressive disorder, recurrent severe without psychotic features: Secondary | ICD-10-CM | POA: Diagnosis not present

## 2024-01-20 MED ORDER — NAPHAZOLINE-GLYCERIN 0.012-0.25 % OP SOLN
1.0000 [drp] | Freq: Four times a day (QID) | OPHTHALMIC | Status: DC | PRN
  Administered 2024-01-22: 2 [drp] via OPHTHALMIC

## 2024-01-20 NOTE — BH IP Treatment Plan (Signed)
Interdisciplinary Treatment and Diagnostic Plan Update  01/20/2024 Time of Session: 10:33 am Betty Davis MRN: [id number]  Principal Diagnosis: MDD (major depressive disorder), recurrent episode, severe (HCC)  Secondary Diagnoses: Principal Problem:   MDD (major depressive disorder), recurrent episode, severe (HCC)   Current Medications:  Current Facility-Administered Medications  Medication Dose Route Frequency Provider Last Rate Last Admin   alum & mag hydroxide-simeth (MAALOX/MYLANTA) 200-200-20 MG/5ML suspension 30 mL  30 mL Oral Q6H PRN Chales Abrahams, NP       atomoxetine (STRATTERA) capsule 80 mg  80 mg Oral Daily Ophelia Shoulder E, NP   80 mg at 01/20/24 0857   diphenhydrAMINE (BENADRYL) injection 50 mg  50 mg Intramuscular TID PRN Chales Abrahams, NP       fluticasone (FLONASE) 50 MCG/ACT nasal spray 1 spray  1 spray Each Nare BID Ophelia Shoulder E, NP   1 spray at 01/20/24 0857   hydrOXYzine (ATARAX) tablet 10 mg  10 mg Oral BID PRN Chales Abrahams, NP       influenza vac split trivalent PF (FLULAVAL) injection 0.5 mL  0.5 mL Intramuscular Tomorrow-1000 Leata Mouse, MD       magnesium hydroxide (MILK OF MAGNESIA) suspension 30 mL  30 mL Oral QHS PRN Ophelia Shoulder E, NP       mirtazapine (REMERON) tablet 30 mg  30 mg Oral QHS Ophelia Shoulder E, NP   30 mg at 01/18/24 2105   norethindrone-ethinyl estradiol-FE (LOESTRIN FE) 1-20 MG-MCG per tablet 1 tablet  1 tablet Oral Daily Ophelia Shoulder E, NP   1 tablet at 01/20/24 4098   PTA Medications: Medications Prior to Admission  Medication Sig Dispense Refill Last Dose/Taking   atomoxetine (STRATTERA) 80 MG capsule Take 1 capsule (80 mg total) by mouth in the morning. 90 capsule 1    fluticasone (FLONASE) 50 MCG/ACT nasal spray Place 1 spray into both nostrils 2 (two) times daily. 48 g 1    hydrOXYzine (ATARAX) 10 MG tablet Take 1 tablet (10 mg total) by mouth one to two times daily as needed. (Patient taking differently: Take  10 mg by mouth 2 (two) times daily as needed for anxiety.) 60 tablet 0    ibuprofen (ADVIL) 200 MG tablet Take 200 mg by mouth every 6 (six) hours as needed for headache.      LOESTRIN FE 1/20 1-20 MG-MCG tablet Take 1 tablet by mouth daily. 84 tablet 1    mirtazapine (REMERON) 30 MG tablet Take 1 tablet (30 mg total) by mouth at bedtime. 30 tablet 0     Patient Stressors: Educational concerns   Medication change or noncompliance    Patient Strengths: Motivation for treatment/growth  Supportive family/friends   Treatment Modalities: Medication Management, Group therapy, Case management,  1 to 1 session with clinician, Psychoeducation, Recreational therapy.   Physician Treatment Plan for Primary Diagnosis: MDD (major depressive disorder), recurrent episode, severe (HCC) Long Term Goal(s): Improvement in symptoms so as ready for discharge   Short Term Goals: Ability to identify changes in lifestyle to reduce recurrence of condition will improve Ability to verbalize feelings will improve Ability to disclose and discuss suicidal ideas Ability to demonstrate self-control will improve Ability to identify and develop effective coping behaviors will improve Ability to maintain clinical measurements within normal limits will improve Compliance with prescribed medications will improve Ability to identify triggers associated with substance abuse/mental health issues will improve  Medication Management: Evaluate patient's response, side effects, and tolerance of medication regimen.  Therapeutic Interventions: 1 to 1 sessions, Unit Group sessions and Medication administration.  Evaluation of Outcomes: Not Progressing  Physician Treatment Plan for Secondary Diagnosis: Principal Problem:   MDD (major depressive disorder), recurrent episode, severe (HCC)  Long Term Goal(s): Improvement in symptoms so as ready for discharge   Short Term Goals: Ability to identify changes in lifestyle to reduce  recurrence of condition will improve Ability to verbalize feelings will improve Ability to disclose and discuss suicidal ideas Ability to demonstrate self-control will improve Ability to identify and develop effective coping behaviors will improve Ability to maintain clinical measurements within normal limits will improve Compliance with prescribed medications will improve Ability to identify triggers associated with substance abuse/mental health issues will improve     Medication Management: Evaluate patient's response, side effects, and tolerance of medication regimen.  Therapeutic Interventions: 1 to 1 sessions, Unit Group sessions and Medication administration.  Evaluation of Outcomes: Not Progressing   RN Treatment Plan for Primary Diagnosis: MDD (major depressive disorder), recurrent episode, severe (HCC) Long Term Goal(s): Knowledge of disease and therapeutic regimen to maintain health will improve  Short Term Goals: Ability to remain free from injury will improve, Ability to verbalize frustration and anger appropriately will improve, Ability to demonstrate self-control, Ability to participate in decision making will improve, Ability to verbalize feelings will improve, Ability to disclose and discuss suicidal ideas, Ability to identify and develop effective coping behaviors will improve, and Compliance with prescribed medications will improve  Medication Management: RN will administer medications as ordered by provider, will assess and evaluate patient's response and provide education to patient for prescribed medication. RN will report any adverse and/or side effects to prescribing provider.  Therapeutic Interventions: 1 on 1 counseling sessions, Psychoeducation, Medication administration, Evaluate responses to treatment, Monitor vital signs and CBGs as ordered, Perform/monitor CIWA, COWS, AIMS and Fall Risk screenings as ordered, Perform wound care treatments as ordered.  Evaluation  of Outcomes: Not Progressing   LCSW Treatment Plan for Primary Diagnosis: MDD (major depressive disorder), recurrent episode, severe (HCC) Long Term Goal(s): Safe transition to appropriate next level of care at discharge, Engage patient in therapeutic group addressing interpersonal concerns.  Short Term Goals: Engage patient in aftercare planning with referrals and resources, Increase social support, Increase ability to appropriately verbalize feelings, Increase emotional regulation, and Increase skills for wellness and recovery  Therapeutic Interventions: Assess for all discharge needs, 1 to 1 time with Social worker, Explore available resources and support systems, Assess for adequacy in community support network, Educate family and significant other(s) on suicide prevention, Complete Psychosocial Assessment, Interpersonal group therapy.  Evaluation of Outcomes: Not Progressing   Progress in Treatment: Attending groups: Yes. Participating in groups: Yes. Taking medication as prescribed: Yes. Toleration medication: Yes. Family/Significant other contact made: No, will contact:  mother Betty Davis [phone] Patient understands diagnosis: Yes. Discussing patient identified problems/goals with staff: Yes. Medical problems stabilized or resolved: Yes. Denies suicidal/homicidal ideation: Yes. Issues/concerns per patient self-inventory: No. Other: none reported  New problem(s) identified: No, Describe:  none reported  New Short Term/Long Term Goal(s): Safe transition to appropriate next level of care at discharge, Engage patient in therapeutic groups addressing interpersonal concerns.    Patient Goals:  " I would like to work being more positive, not to angry with my family when they are trying to help me"  Discharge Plan or Barriers: Patient to return to parent/guardian care. Patient to follow up with outpatient therapy and medication management services.    Reason for  Continuation of Hospitalization: Anxiety Depression Suicidal ideation  Estimated Length of Stay: 5-7 days  Last 3 [place] Suicide Severity Risk Score: Flowsheet Row [facility] (Current) [facility] from [facility]/17/2024 in [facility] ED from 02/21/2022 in [facility]   C-SSRS RISK CATEGORY High Risk High Risk Error: Question 6 not populated       Last [facility] 2/9 Scores:    09/29/2021    6:34 PM  Depression screen PHQ 2/9  Decreased Interest 1  Down, Depressed, Hopeless 3  PHQ - 2 Score 4  Altered sleeping 3  Tired, decreased energy 1  Change in appetite 1  Feeling bad or failure about yourself  2  Trouble concentrating 2  Moving slowly or fidgety/restless 0  Suicidal thoughts 1  PHQ-9 Score 14  Difficult doing work/chores Somewhat difficult    Scribe for Treatment Team: Kathrynn Humble 01/20/2024 11:52 AM

## 2024-01-20 NOTE — Progress Notes (Signed)
Patient appears depressed. Patient denies SI/HI/AVH. Pt reports anxiety is 3/10 and depression is 1/10. Pt reports good sleep and good appetite. Patient complied with morning medication with no reported side effects. Patient remains safe on Q9min checks and contracts for safety.    Pt reports she graduated high school Jan 06, 2024.    01/20/24 1300  Psych Admission Type (Psych Patients Only)  Admission Status Voluntary/72 hour document signed  Psychosocial Assessment  Patient Complaints Depression  Eye Contact Fair  Facial Expression Anxious  Affect Appropriate to circumstance  Speech Logical/coherent  Interaction Minimal  Motor Activity Fidgety  Appearance/Hygiene Unremarkable  Behavior Characteristics Cooperative  Mood Anxious;Pleasant  Thought Process  Coherency WDL  Content WDL  Delusions None reported or observed  Perception WDL  Hallucination None reported or observed  Judgment Poor  Confusion None  Danger to Self  Current suicidal ideation? Denies  Danger to Others  Danger to Others None reported or observed

## 2024-01-20 NOTE — Plan of Care (Signed)
  Problem: Education: Goal: Knowledge of [facility] Education information/materials will improve Outcome: Progressing Goal: Emotional status will improve Outcome: Progressing Goal: Mental status will improve Outcome: Progressing Goal: Verbalization of understanding the information provided will improve Outcome: Progressing   Problem: Activity: Goal: Interest or engagement in activities will improve Outcome: Progressing Goal: Sleeping patterns will improve Outcome: Progressing   Problem: Coping: Goal: Ability to verbalize frustrations and anger appropriately will improve Outcome: Progressing Goal: Ability to demonstrate self-control will improve Outcome: Progressing   Problem: Health Behavior/Discharge Planning: Goal: Identification of resources available to assist in meeting health care needs will improve Outcome: Progressing Goal: Compliance with treatment plan for underlying cause of condition will improve Outcome: Progressing   Problem: Physical Regulation: Goal: Ability to maintain clinical measurements within normal limits will improve Outcome: Progressing   Problem: Safety: Goal: Periods of time without injury will increase Outcome: Progressing   Problem: Education: Goal: Utilization of techniques to improve thought processes will improve Outcome: Progressing Goal: Knowledge of the prescribed therapeutic regimen will improve Outcome: Progressing   Problem: Activity: Goal: Interest or engagement in leisure activities will improve Outcome: Progressing Goal: Imbalance in normal sleep/wake cycle will improve Outcome: Progressing   Problem: Coping: Goal: Coping ability will improve Outcome: Progressing Goal: Will verbalize feelings Outcome: Progressing   Problem: Health Behavior/Discharge Planning: Goal: Ability to make decisions will improve Outcome: Progressing Goal: Compliance with therapeutic regimen will improve Outcome: Progressing    Problem: Role Relationship: Goal: Will demonstrate positive changes in social behaviors and relationships Outcome: Progressing   Problem: Safety: Goal: Ability to disclose and discuss suicidal ideas will improve Outcome: Progressing Goal: Ability to identify and utilize support systems that promote safety will improve Outcome: Progressing   Problem: Self-Concept: Goal: Will verbalize positive feelings about self Outcome: Progressing Goal: Level of anxiety will decrease Outcome: Progressing   Problem: Education: Goal: Ability to incorporate positive changes in behavior to improve self-esteem will improve Outcome: Progressing   Problem: Health Behavior/Discharge Planning: Goal: Ability to identify and utilize available resources and services will improve Outcome: Progressing Goal: Ability to remain free from injury will improve Outcome: Progressing   Problem: Self-Concept: Goal: Will verbalize positive feelings about self Outcome: Progressing   Problem: Skin Integrity: Goal: Demonstration of wound healing without infection will improve Outcome: Progressing   Problem: Education: Goal: Ability to make informed decisions regarding treatment will improve Outcome: Progressing   Problem: Coping: Goal: Coping ability will improve Outcome: Progressing   Problem: Health Behavior/Discharge Planning: Goal: Identification of resources available to assist in meeting health care needs will improve Outcome: Progressing   Problem: Medication: Goal: Compliance with prescribed medication regimen will improve Outcome: Progressing   Problem: Self-Concept: Goal: Ability to disclose and discuss suicidal ideas will improve Outcome: Progressing Goal: Will verbalize positive feelings about self Outcome: Progressing Note: Patient is on track. Patient will maintain adherence    Problem: Education: Goal: Ability to state activities that reduce stress will improve Outcome:  Progressing   Problem: Coping: Goal: Ability to identify and develop effective coping behavior will improve Outcome: Progressing   Problem: Self-Concept: Goal: Ability to identify factors that promote anxiety will improve Outcome: Progressing Goal: Level of anxiety will decrease Outcome: Progressing Goal: Ability to modify response to factors that promote anxiety will improve Outcome: Progressing

## 2024-01-20 NOTE — [facility] Group Notes (Signed)
Group Topic/Focus:  Goals Group:   The focus of this group is to help patients establish daily goals to achieve during treatment and discuss how the patient can incorporate goal setting into their daily lives to aide in recovery.       Participation Level:  Active   Participation Quality:  Attentive   Affect:  Appropriate   Cognitive:  Appropriate   Insight: Appropriate   Engagement in Group:  Engaged   Modes of Intervention:  Discussion   Additional Comments:   Patient attended goals group and was attentive the duration of it. Patient's goal was to be more positive. Pt has no feelings of wanting to hurt herself or others.

## 2024-01-20 NOTE — Progress Notes (Signed)
72 Hour Document Signed Note  Patient Details Name: Vantasia Pinkney MRN: [id number] DOB: 2006/01/06 Today's Date: 01/20/2024   72 Hour Signed Documentation:  Admission Status: Voluntary/72 hour document signed Date 72 hour document signed : 01/20/24 Time 72 hour document signed : [id number] Provider Notified (First and Last Name) (see details for LINK to note): Dr. Lynnell Dike 01/20/2024, 9:19 AM

## 2024-01-20 NOTE — [facility] Group Notes (Signed)
Adult Psychoeducational Group Note  Date:  01/20/2024 Time:  9:14 PM  Group Topic/Focus:  Wrap-Up Group:   The focus of this group is to help patients review their daily goal of treatment and discuss progress on daily workbooks.  Participation Level:  Active  Participation Quality:  Appropriate  Affect:  Appropriate  Cognitive:  Appropriate  Insight: Appropriate  Engagement in Group:  Engaged  Modes of Intervention:  Discussion  Additional Comments:  Pt Attended group.  Joselyn Arrow 01/20/2024, 9:14 PM

## 2024-01-20 NOTE — Progress Notes (Signed)
[facility] MD Progress Note  01/20/2024 1:49 PM Betty Davis  MRN:  [id number]  Principal Problem: MDD (major depressive disorder), recurrent episode, severe (HCC) Diagnosis: Principal Problem:   MDD (major depressive disorder), recurrent episode, severe (HCC)  Reason for admission:   Betty Davis is 18 years old twelfth-grader at the [employer] in [place] making grades of B's and 1C.  Patient presents with prior psychiatric history significant for MDD, recurrent episode, ADHD, and generalized anxiety disorder.  Patient presents to [facility] Child and Adolescent unit [facility] for worsening depressive symptoms with self mutilation of cutting her thigh in the context of school stress, separation from boyfriend, bullying related to social media.   Today's assessment notes:      On assessment today, the pt reports that her mood is less depressed and improving.  Chart reviewed and findings shared with the treatment team and consult with attending psychiatrist.  Patient is alert, calm, pleasant, and oriented to person, place, time, and situation.  Observe attending and participating in therapeutic milieu and unit group activities.  Denies any acute discomfort.  Reports not planning to take any medication as per agreement with the mom in preparation to being transferred to [facility] this week.  Denies delusional thinking or paranoia.  Further, denies SI, HI, or AVH. Reports that anxiety is at manageable level Sleep is improved, and slept over 9 hours last night Appetite is better, ate all have breakfast and lunch today Concentration is normal Energy level is adequate Patient denies suicidal thoughts.  Denies suicidal intent and plan.  Denies having any HI.  Denies having psychotic symptoms.   Total Time spent with patient: 45 minutes  Past Psychiatric History: Previous Psych Diagnoses: Major depressive disorder recurrent severe,  GAD Prior inpatient treatment: [facility] in 2023 hospitalized for depression, [facility] in 2022 hospitalized for SI and anxiety Current/prior outpatient treatment: Yes Prior rehab hx: Denies Psychotherapy hx: Yes History of suicide: Yes self-mutilation recent History of homicide or aggression: Denies Psychiatric medication history: Patient has been on trial Strattera, hydroxyzine, Remeron. Psychiatric medication compliance history: Yes Neuromodulation history: Denies Current Psychiatrist: Yes Current therapist: Yes  Past Medical History:  Past Medical History:  Diagnosis Date   ADHD (attention deficit hyperactivity disorder)    History reviewed. No pertinent surgical history. Family History:  Family History  Problem Relation Age of Onset   Healthy Mother    Drug abuse Paternal Aunt    Suicidality Paternal Aunt    Suicidality Paternal Uncle    Drug abuse Paternal Uncle    Cancer Maternal Grandmother    Family Psychiatric  History: See H&P Social History:  Social History   Substance and Sexual Activity  Alcohol Use Never     Social History   Substance and Sexual Activity  Drug Use Never    Social History   Socioeconomic History   Marital status: Single    Spouse name: Not on file   Number of children: Not on file   Years of education: Not on file   Highest education level: Not on file  Occupational History   Not on file  Tobacco Use   Smoking status: Never    Passive exposure: Never   Smokeless tobacco: Never  Vaping Use   Vaping status: Never Used  Substance and Sexual Activity   Alcohol use: Never   Drug use: Never   Sexual activity: Never  Other Topics Concern   Not on file  Social History Narrative   Not on file   Social Drivers of Health   Financial Resource Strain: Not on file  Food Insecurity: Not on file  Transportation Needs: Not on file  Physical Activity: Not on file  Stress: Not on file  Social Connections: Not on file   Additional  Social History:   Sleep: Good  Appetite:  Good  Current Medications: Current Facility-Administered Medications  Medication Dose Route Frequency Provider Last Rate Last Admin   alum & mag hydroxide-simeth (MAALOX/MYLANTA) 200-200-20 MG/5ML suspension 30 mL  30 mL Oral Q6H PRN Chales Abrahams, NP       atomoxetine (STRATTERA) capsule 80 mg  80 mg Oral Daily Ophelia Shoulder E, NP   80 mg at 01/20/24 0857   diphenhydrAMINE (BENADRYL) injection 50 mg  50 mg Intramuscular TID PRN Chales Abrahams, NP       fluticasone (FLONASE) 50 MCG/ACT nasal spray 1 spray  1 spray Each Nare BID Ophelia Shoulder E, NP   1 spray at 01/20/24 0857   hydrOXYzine (ATARAX) tablet 10 mg  10 mg Oral BID PRN Chales Abrahams, NP       influenza vac split trivalent PF (FLULAVAL) injection 0.5 mL  0.5 mL Intramuscular Tomorrow-1000 Leata Mouse, MD       magnesium hydroxide (MILK OF MAGNESIA) suspension 30 mL  30 mL Oral QHS PRN Ophelia Shoulder E, NP       mirtazapine (REMERON) tablet 30 mg  30 mg Oral QHS Ophelia Shoulder E, NP   30 mg at 01/18/24 2105   naphazoline-glycerin (CLEAR EYES REDNESS) ophth solution 1-2 drop  1-2 drop Both Eyes QID PRN Leata Mouse, MD       norethindrone-ethinyl estradiol-FE (LOESTRIN FE) 1-20 MG-MCG per tablet 1 tablet  1 tablet Oral Daily Ophelia Shoulder E, NP   1 tablet at 01/20/24 1610   Lab Results: No results found for this or any previous visit (from the past 48 hours).  Blood Alcohol level:  Lab Results  Component Value Date   ETH <10 01/17/2024   ETH <10 02/15/2022    Metabolic Disorder Labs: Lab Results  Component Value Date   HGBA1C 5.0 10/01/2021   MPG 96.8 10/01/2021   No results found for: "PROLACTIN" Lab Results  Component Value Date   CHOL 126 10/01/2021   TRIG 73 10/01/2021   HDL 47 10/01/2021   CHOLHDL 2.7 10/01/2021   VLDL 15 10/01/2021   LDLCALC 64 10/01/2021   Physical Findings: AIMS:  , ,  ,  ,    CIWA:    COWS:      Musculoskeletal: Strength & Muscle Tone: within normal limits Gait & Station: normal Patient leans: N/A  Psychiatric Specialty Exam:  Presentation  General Appearance:  Appropriate for Environment; Casual  Eye Contact: Good  Speech: Clear and Coherent; Normal Rate  Speech Volume: Normal  Handedness: Right  Mood and Affect  Mood: Euthymic  Affect: Congruent  Thought Process  Thought Processes: Coherent; Goal Directed  Descriptions of Associations:Intact  Orientation:Full (Time, Place and Person)  Thought Content:Logical  History of Schizophrenia/Schizoaffective disorder:No  Duration of Psychotic Symptoms:No data recorded Hallucinations:Hallucinations: None  Ideas of Reference:None  Suicidal Thoughts:Suicidal Thoughts: No  Homicidal Thoughts:Homicidal Thoughts: No  Sensorium  Memory: Immediate Good; Recent Good  Judgment: Fair  Insight: Fair  Executive Functions  Concentration: Good  Attention Span: Good  Recall: Fair  Fund of Knowledge: Fair  Language: Good  Psychomotor Activity  Psychomotor Activity: Psychomotor Activity:  Normal  Assets  Assets: Manufacturing systems engineer; Desire for Improvement; Physical Health; Resilience; Social Support  Sleep  Sleep: Sleep: Good Number of Hours of Sleep: 9  Physical Exam: Physical Exam Vitals and nursing note reviewed.  Constitutional:      Appearance: Normal appearance.  HENT:     Head: Normocephalic.     Nose: Nose normal.     Mouth/Throat:     Mouth: Mucous membranes are moist.     Pharynx: Oropharynx is clear.  Eyes:     Extraocular Movements: Extraocular movements intact.  Cardiovascular:     Rate and Rhythm: Normal rate.     Pulses: Normal pulses.  Pulmonary:     Effort: Pulmonary effort is normal.  Abdominal:     Comments: Deferred  Genitourinary:    Comments: Deferred Musculoskeletal:        General: Normal range of motion.     Cervical back: Normal range of  motion.  Skin:    General: Skin is warm.  Neurological:     General: No focal deficit present.     Mental Status: She is alert and oriented to person, place, and time.  Psychiatric:        Mood and Affect: Mood normal.        Behavior: Behavior normal.        Thought Content: Thought content normal.    Review of Systems  Constitutional:  Negative for chills and fever.  HENT:  Negative for sore throat.   Eyes:  Negative for blurred vision.  Respiratory:  Negative for cough, sputum production, shortness of breath and wheezing.   Cardiovascular:  Negative for chest pain and palpitations.  Gastrointestinal:  Negative for abdominal pain, constipation, diarrhea, heartburn, nausea and vomiting.  Genitourinary:  Negative for dysuria, frequency and urgency.  Musculoskeletal: Negative.   Skin:  Negative for itching and rash.  Neurological:  Negative for dizziness, tingling, tremors and headaches.  Endo/Heme/Allergies:        See allergy listing  Psychiatric/Behavioral:  Positive for depression. The patient is nervous/anxious.    Blood pressure 116/68, pulse 88, temperature 97.6 F (36.4 C), resp. rate 14, height 5\' 4"  (1.626 m), weight 54.4 kg, last menstrual period 01/10/2024, SpO2 100%. Body mass index is 20.59 kg/m.  Treatment Plan Summary: Daily contact with patient to assess and evaluate symptoms and progress in treatment and Medication management Treatment Plan Summary:   Patient was admitted to the Child and adolescent  unit at [facility] under the service of Dr. Elsie Saas. Routine labs, which include CMP: Within normal limits.  Glucose: 121 elevated. CBC with differential: Within normal limits. UDS: Negative for any substances., UA: Obtained.  Pregnancy test: Negative.  Medical consultation were reviewed and routine PRN's were ordered for the patient.  Respiratory panel was negative and pending rest of the labs. Will maintain Q 15 minutes observation for  safety. During this hospitalization the patient will receive psychosocial and education assessment Patient will participate in  group, milieu, and family therapy. Psychotherapy:  Social and Doctor, hospital, anti-bullying, learning based strategies, cognitive behavioral, and family object relations individuation separation intervention psychotherapies can be considered. Medication management: Patient will be starting her home medication Remeron 30 mg p.o. daily at bedtime for depression and sleep, Strattera capsule 80 mg p.o. daily for ADHD, birth control pills LOESTRIN 1-20 MG-MCG per tab p.o. daily, Flonase 50 mcg /ACT nasal spray 1 spray each nostril 2 times daily for allergies.  Patient may take  hydroxyzine 25 mg at bedtime as needed which can be repeated times once. Patient parents provided informed consent after brief brief discussion about risk and benefits.  However, mother is refusing all medications at this time. Patient and guardian were educated about medication efficacy and side effects.  Patient not agreeable with medication trial will speak with guardian.  Will continue to monitor patient's mood and behavior. To schedule a Family meeting to obtain collateral information and discuss discharge and follow up plan.   Physician Treatment Plan for Primary Diagnosis: MDD (major depressive disorder), recurrent episode, severe (HCC) Long Term Goal(s): Improvement in symptoms so as ready for discharge   Short Term Goals: Ability to identify changes in lifestyle to reduce recurrence of condition will improve, Ability to verbalize feelings will improve, Ability to disclose and discuss suicidal ideas, Ability to demonstrate self-control will improve, Ability to identify and develop effective coping behaviors will improve, Ability to maintain clinical measurements within normal limits will improve, Compliance with prescribed medications will improve, and Ability to identify triggers  associated with substance abuse/mental health issues will improve   Physician Treatment Plan for Secondary Diagnosis: Principal Problem:   MDD (major depressive disorder), recurrent episode, severe (HCC)   I certify that inpatient services furnished can reasonably be expected to improve the patient's condition.    Cecilie Lowers, FNP 01/20/2024, 1:49 PM

## 2024-01-20 NOTE — Progress Notes (Signed)
   01/19/24 2030  Psych Admission Type (Psych Patients Only)  Admission Status Voluntary  Psychosocial Assessment  Patient Complaints Anxiety  Eye Contact Fair  Facial Expression Anxious  Affect Appropriate to circumstance  Speech Logical/coherent  Interaction Minimal  Motor Activity Fidgety  Appearance/Hygiene Unremarkable  Behavior Characteristics Appropriate to situation;Cooperative  Mood Anxious;Pleasant  Thought Process  Coherency WDL  Content WDL  Delusions None reported or observed  Perception WDL  Hallucination None reported or observed  Judgment WDL  Confusion None  Danger to Self  Current suicidal ideation? Denies  Danger to Others  Danger to Others None reported or observed

## 2024-01-20 NOTE — [facility] Counselor (Signed)
Adult Comprehensive Assessment  Patient ID: Shantara Goosby, female   DOB: 07-25-2006, 18 y.o.   MRN: [id number]  Information Source: Information source: Patient  Current Stressors:  Patient states their primary concerns and needs for treatment are:: "to get help" Patient states their goals for this hospitilization and ongoing recovery are:: "to feel better" Educational / Learning stressors: reports that she is in the 12th grade and has some stress with classes and peers Employment / Job issues: denies Family Relationships: "okEngineer, petroleum / Lack of resources (include bankruptcy): n/a Housing / Lack of housing: lives with mother Physical health (include injuries & life threatening diseases): denies Social relationships: poor social interactions Substance abuse: denies Bereavement / Loss: does not want to talk about it  Living/Environment/Situation:  Living Arrangements: Parent What is atmosphere in current home: Supportive  Family History:  Marital status: Single Does patient have children?: No  Childhood History:  By whom was/is the patient raised?: Both parents Description of patient's relationship with caregiver when they were a child: "good" Patient's description of current relationship with people who raised him/her: "good" Did patient suffer any verbal/emotional/physical/sexual abuse as a child?: No Did patient suffer from severe childhood neglect?: No Has patient ever been sexually abused/assaulted/raped as an adolescent or adult?: No Was the patient ever a victim of a crime or a disaster?: No Witnessed domestic violence?: No Has patient been affected by domestic violence as an adult?: No  Education:  Highest grade of school patient has completed: 11 Currently a student?: Yes Name of school: [employer] How long has the patient attended?: since 9th grade Learning disability?: No  Employment/Work Situation:   Employment Situation: Surveyor, 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?: No  Financial Resources:   Surveyor, quantity resources: Support from parents / caregiver Does patient have a Lawyer or guardian?: No  Alcohol/Substance Abuse:   If attempted suicide, did drugs/alcohol play a role in this?: No  Social Support System:   Forensic psychologist System: Good Describe Community Support System: has a Visual merchandiser in the community  Leisure/Recreation:   Do You Have Hobbies?: Yes Leisure and Hobbies: "I don't know"  Strengths/Needs:   What is the patient's perception of their strengths?: "I don't know" Patient states they can use these personal strengths during their treatment to contribute to their recovery: "I don't know" Patient states these barriers may affect/interfere with their treatment: "I don't know" Patient states these barriers may affect their return to the community: "I don't know" Other important information patient would like considered in planning for their treatment: "I don't know"  Discharge Plan:   Currently receiving community mental health services: Yes (From Whom) Patient states concerns and preferences for aftercare planning are: Patient will be admitted into residental facility in [place] upon discharge Patient states they will know when they are safe and ready for discharge when: "I am feeling a little better now" Does patient have access to transportation?: Yes Does patient have financial barriers related to discharge medications?: No Patient description of barriers related to discharge medications: mother does not want her on medication at this time due to pending admission into residential facility Plan for living situation after discharge: residential facility, [facility], in [place] Will patient be returning to same living situation after discharge?: No  Summary/Recommendations:   Summary and Recommendations (to be completed by  the evaluator): Pennie Vanblarcom is 18 years old twelfth-grader at the [employer] in  [place] making grades of B's and 1C.  Patient presents with prior psychiatric history significant for MDD, recurrent episode, ADHD, and generalized anxiety disorder.  Admitted due to worsening depressive symptoms with self-mutilation of cutting her thigh in the context of school stress, separation from boyfriend, bullying related to social media.      Patient reports that she has been under a lot of stress due to school demands, thinking about graduation, finding a job, bullying and stress related to social medial.  Reports that for the past 3 years she has been depressed. Has a therapist and a psychiatrist and feels better working with them but it only lasts a little bit and then she feels worse.  Her stressors have been getting worse and about 1 week ago she stopped her medication and now is feeling worse.     Reports burning herself, and superficiality cutting herself relieve anxiety.  Reports her father passed away last year and she is still grieving for him.  Reports she did have thoughts of suicide last year but now is feeling more hopelessness and she and her mother is concerned her symptoms will continue to worsen without inpatient care.  She has thoughts of hurting herself but doesn't have a plan. Does report herself harm behaviors/cutting/burning/picking finger cuticles are more for anxiety relief. She also reports previous eating disorder and sleep problems.    Patient was admitted into a residential facility, [facility], in [place] upon discharge from [facility]  While here, Ronnetta can benefit from crisis stabilization, medication management, therapeutic milieu, and referrals for services.   Marinda Elk. 01/20/2024

## 2024-01-21 DIAGNOSIS — F332 Major depressive disorder, recurrent severe without psychotic features: Secondary | ICD-10-CM | POA: Diagnosis not present

## 2024-01-21 MED ORDER — HYDROCORTISONE 1 % EX CREA
TOPICAL_CREAM | Freq: Two times a day (BID) | CUTANEOUS | Status: DC
  Filled 2024-01-21: qty 28

## 2024-01-21 MED ORDER — HYDROCORTISONE 1 % EX CREA
TOPICAL_CREAM | Freq: Two times a day (BID) | CUTANEOUS | Status: DC
  Filled 2024-01-21 (×2): qty 28

## 2024-01-21 NOTE — Progress Notes (Signed)
   01/20/24 2305  Psych Admission Type (Psych Patients Only)  Admission Status Voluntary/72 hour document signed  Psychosocial Assessment  Patient Complaints Sleep disturbance  Eye Contact Fair  Facial Expression Anxious  Affect Anxious  Speech Logical/coherent  Interaction Attention-seeking  Motor Activity Fidgety  Appearance/Hygiene Unremarkable  Behavior Characteristics Cooperative;Fidgety  Mood Anxious  Thought Process  Coherency WDL  Content WDL  Delusions WDL  Perception WDL  Hallucination None reported or observed  Judgment Poor  Confusion WDL  Danger to Self  Current suicidal ideation? Denies  Danger to Others  Danger to Others None reported or observed   Pt rated day a 7/10 and goal was to not be as hard on herself, denies SI/HI or hallucinations (a) 15 min checks (r) safety maintained.

## 2024-01-21 NOTE — Group Note (Signed)
LCSW Group Therapy Note   Group Date: 01/21/2024 Start Time: 1330 End Time: 1430   Type of Therapy and Topic:  Group Therapy - Who Am I?  Participation Level:  Active   Description of Group The focus of this group was to aid patients in self-exploration and awareness. Patients were guided in exploring various factors of oneself to include interests, readiness to change, management of emotions, and individual perception of self. Patients were provided with complementary worksheets exploring hidden talents, ease of asking other for help, music/media preferences, understanding and responding to feelings/emotions, and hope for the future. At group closing, patients were encouraged to adhere to discharge plan to assist in continued self-exploration and understanding.  Therapeutic Goals Patients learned that self-exploration and awareness is an ongoing process Patients identified their individual skills, preferences, and abilities Patients explored their openness to establish and confide in supports Patients explored their readiness for change and progression of mental health   Summary of Patient Progress:  Patient actively engaged in introductory check-in. Patient actively engaged in activity of self-exploration and identification, and completing complementary worksheet to assist in discussion. Patient identified various factors ranging from hidden talents, favorite music and movies, trusted individuals, accountability, and individual perceptions of self and hope. Pt identified that the coping skill she uses most often is exercising or working out. Pt engaged in processing thoughts and feelings as well as means of reframing thoughts. Pt proved receptive of alternate group members input and feedback from CSW.   Therapeutic Modalities Cognitive Behavioral Therapy Motivational Interviewing  Cherly Hensen, LCSW 01/21/2024  2:43 PM

## 2024-01-21 NOTE — Progress Notes (Signed)
1:27 PM - CSW spoke with Sharlee Blew [phone], admissions coordinator, at [facility]. CSW advised Leta Jungling that pt is discharging from [facility] tomorrow 1/31. Leta Jungling reports understanding.  Cathie Beams, MSW, LCSW 01/21/2024 1:32 PM

## 2024-01-21 NOTE — Progress Notes (Signed)
   01/21/24 0800  Psych Admission Type (Psych Patients Only)  Admission Status Voluntary/72 hour document signed  Psychosocial Assessment  Patient Complaints None  Eye Contact Fair  Facial Expression Animated  Affect Appropriate to circumstance  Speech Logical/coherent  Interaction Attention-seeking  Motor Activity Fidgety  Appearance/Hygiene Unremarkable  Behavior Characteristics Cooperative  Mood Pleasant  Thought Process  Coherency WDL  Content WDL  Delusions None reported or observed  Perception WDL  Hallucination None reported or observed  Judgment Poor  Confusion None  Danger to Self  Current suicidal ideation? Denies  Danger to Others  Danger to Others None reported or observed

## 2024-01-21 NOTE — Progress Notes (Signed)
Nursing Note:  Pt spoken to about focusing on herself and not to focus other peers in milieu. This request has been addressed multiple times throughout her stay. Pt observed talking with 18 year old female this shift with question as to whether she was flirting. Peers also asking, "Is it appropriate for an 69 year old to date a 18 year old?" This RN spoke to pt privately and asked her to focus on herself and leave a distance between peers when sitting together.  This RN asked pt why she didn't leave today, pt responded, "I wanted more time to spend with my friends and to prepare my mind for discharge."  Discussed that since she has graduated from [employer] and is 18 years old, it is appropriate that she be on the adult unit. Discussed unit rules, provided active listening and support.  Pt very tearful and shared that felt that she had been accused of something that she had not done.  Pt also shared that she felt that staff was unprofessional at times and were not always supportive. Active listening provided.   Pt also spoke with MHT about her feelings and requested to talk with someone else to share her feelings. Nurse Manager met with pt and provided support and guidance as well. This RN called her mother to let her know that pt is upset and prepare her for visitation.  Mother verbalized understanding and agreed that the pt often times "gets involved in helping others and not focusing on herself." During visitation pt remained upset, shared that she has not been treated fairly by staff.  This RN, mother and pt met separately. Pt shared many complaints about how she felt judged and not supported by some staff members. Active listening provided. Mother offered to pick the pt up earlier in day tomorrow, instead of planned 1630 discharge. Pt stated that she did not want to leave early and wanted more time with her friends.  Pt continues to deny SI/HI/AVH.

## 2024-01-21 NOTE — [facility] Suicide Risk Assessment (Signed)
[facility] INPATIENT:  Family/Significant Other Suicide Prevention Education  Suicide Prevention Education:  Education Completed; Estate agent, pt's mother (name of family member/significant other) has been identified by the patient as the family member/significant other with whom the patient will be residing, and identified as the person(s) who will aid the patient in the event of a mental health crisis (suicidal ideations/suicide attempt).  With written consent from the patient, the family member/significant other has been provided the following suicide prevention education, prior to the and/or following the discharge of the patient.  The suicide prevention education provided includes the following: Suicide risk factors Suicide prevention and interventions National Suicide Hotline telephone number [facility] assessment telephone number [facility] Emergency Assistance 911 [facility] : [facility] and/or Residential Mobile Crisis Unit telephone number  Request made of family/significant other to: Remove weapons (e.g., guns, rifles, knives), all items previously/currently identified as safety concern.   Remove drugs/medications (over-the-counter, prescriptions, illicit drugs), all items previously/currently identified as a safety concern.  The family member/significant other verbalizes understanding of the suicide prevention education information provided.  The family member/significant other agrees to remove the items of safety concern listed above.  CSW advised?parent/caregiver to purchase a lockbox and place all medications in the home as well as sharp objects (knives, scissors, razors and pencil sharpeners) in it. Parent/caregiver stated "She will be going to a residential facility where they already have those precautions". CSW also advised parent/caregiver to give pt medication instead of letting her take it on her own. Parent/caregiver verbalized understanding and will make  necessary changes.    Shelia Magallon A Shanvi Moyd, LCSW 01/21/2024, 11:08 AM

## 2024-01-21 NOTE — Plan of Care (Signed)
Problem: Education: Goal: Knowledge of [facility] Education information/materials will improve Outcome: Progressing Goal: Emotional status will improve Outcome: Progressing Goal: Mental status will improve Outcome: Progressing Goal: Verbalization of understanding the information provided will improve Outcome: Progressing   Problem: Activity: Goal: Interest or engagement in activities will improve Outcome: Progressing Goal: Sleeping patterns will improve Outcome: Progressing   Problem: Coping: Goal: Ability to verbalize frustrations and anger appropriately will improve Outcome: Progressing

## 2024-01-21 NOTE — Group Note (Signed)
Date:  01/21/2024 Time:  10:52 AM  Group Topic/Focus:  Goals Group:   The focus of this group is to help patients establish daily goals to achieve during treatment and discuss how the patient can incorporate goal setting into their daily lives to aide in recovery.    Participation Level:  Active  Participation Quality:  Appropriate  Affect:  Appropriate  Cognitive:  Appropriate  Insight: Appropriate  Engagement in Group:  Improving  Modes of Intervention:  Discussion  Additional Comments:  Pt rated her day to 8/10 and sets goal to be positive   Armonii Sieh E Terris Germano 01/21/2024, 10:52 AM

## 2024-01-21 NOTE — Progress Notes (Signed)
[facility] MD Progress Note  01/21/2024 7:14 PM Betty Davis  MRN:  [id number]  Subjective: Betty Davis States, " diarrhea with anal pain."  Principal Problem: MDD (major depressive disorder), recurrent episode, severe (HCC) Diagnosis: Principal Problem:   MDD (major depressive disorder), recurrent episode, severe (HCC)  Reason for admission: Betty Davis is 18 years old twelfth-grader at [employer] in [place] making grades of B's and 1C.  Patient presents with prior psychiatric history significant for MDD, recurrent episode, ADHD, and generalized anxiety disorder.  Patient presents to [facility] Child and Adolescent unit voluntarily from [facility] ED for worsening depressive symptoms with self mutilation of cutting her thigh in the context of school stress, separation from boyfriend, bullying related to social media.   Today's assessment notes: On assessment today, the pt reports that their mood is euthymic, improved since admission, and stable. Denies feeling down, depressed, or sad.  Reports that anxiety symptoms are at manageable level.  Sleep is stable. Appetite is stable.  Concentration is without complaint.  Energy level is adequate. Denies having any suicidal thoughts. Denies having any suicidal intent and plan.  Denies having any HI.  Denies having psychotic symptoms.   Denies having side effects to current psychiatric medications.   Discussed discharge planning: Social worker to follow up with patient discharge planning and coordinate with patient's mother.  Total Time spent with patient: 45 minutes  Past Psychiatric History: Previous Psych Diagnoses: Major depressive disorder recurrent severe, GAD Prior inpatient treatment: [facility] in 2023 hospitalized for depression, [facility] in 2022 hospitalized for SI and anxiety Current/prior outpatient treatment: Yes Prior rehab hx: Denies Psychotherapy hx: Yes History of suicide: Yes self-mutilation  recent History of homicide or aggression: Denies Psychiatric medication history: Patient has been on trial Strattera, hydroxyzine, Remeron. Psychiatric medication compliance history: Yes Neuromodulation history: Denies Current Psychiatrist: Yes Current therapist: Yes  Past Medical History:  Past Medical History:  Diagnosis Date   ADHD (attention deficit hyperactivity disorder)    History reviewed. No pertinent surgical history. Family History:  Family History  Problem Relation Age of Onset   Healthy Mother    Drug abuse Paternal Aunt    Suicidality Paternal Aunt    Suicidality Paternal Uncle    Drug abuse Paternal Uncle    Cancer Maternal Grandmother    Family Psychiatric  History: See H&P  Social History:  Social History   Substance and Sexual Activity  Alcohol Use Never     Social History   Substance and Sexual Activity  Drug Use Never    Social History   Socioeconomic History   Marital status: Single    Spouse name: Not on file   Number of children: Not on file   Years of education: Not on file   Highest education level: Not on file  Occupational History   Not on file  Tobacco Use   Smoking status: Never    Passive exposure: Never   Smokeless tobacco: Never  Vaping Use   Vaping status: Never Used  Substance and Sexual Activity   Alcohol use: Never   Drug use: Never   Sexual activity: Never  Other Topics Concern   Not on file  Social History Narrative   Not on file   Social Drivers of Health   Financial Resource Strain: Not on file  Food Insecurity: Not on file  Transportation Needs: Not on file  Physical Activity: Not on file  Stress: Not on file  Social Connections: Not on  file   Additional Social History:    Sleep: Good  Appetite:  Good  Current Medications: Current Facility-Administered Medications  Medication Dose Route Frequency Provider Last Rate Last Admin   alum & mag hydroxide-simeth (MAALOX/MYLANTA) 200-200-20 MG/5ML  suspension 30 mL  30 mL Oral Q6H PRN Chales Abrahams, NP       atomoxetine (STRATTERA) capsule 80 mg  80 mg Oral Daily Ophelia Shoulder E, NP   80 mg at 01/21/24 0981   diphenhydrAMINE (BENADRYL) injection 50 mg  50 mg Intramuscular TID PRN Chales Abrahams, NP       fluticasone (FLONASE) 50 MCG/ACT nasal spray 1 spray  1 spray Each Nare BID Ophelia Shoulder E, NP   1 spray at 01/21/24 0836   [START ON 01/22/2024] hydrocortisone cream 1 %   Topical BID Caellum Mancil, Jesusita Oka, FNP       hydrOXYzine (ATARAX) tablet 10 mg  10 mg Oral BID PRN Chales Abrahams, NP       magnesium hydroxide (MILK OF MAGNESIA) suspension 30 mL  30 mL Oral QHS PRN Ophelia Shoulder E, NP       mirtazapine (REMERON) tablet 30 mg  30 mg Oral QHS Ophelia Shoulder E, NP   30 mg at 01/20/24 2102   naphazoline-glycerin (CLEAR EYES REDNESS) ophth solution 1-2 drop  1-2 drop Both Eyes QID PRN Leata Mouse, MD       norethindrone-ethinyl estradiol-FE (LOESTRIN FE) 1-20 MG-MCG per tablet 1 tablet  1 tablet Oral Daily Ophelia Shoulder E, NP   1 tablet at 01/21/24 1914   Lab Results: No results found for this or any previous visit (from the past 48 hours).  Blood Alcohol level:  Lab Results  Component Value Date   ETH <10 01/17/2024   ETH <10 02/15/2022   Metabolic Disorder Labs: Lab Results  Component Value Date   HGBA1C 5.0 10/01/2021   MPG 96.8 10/01/2021   No results found for: "PROLACTIN" Lab Results  Component Value Date   CHOL 126 10/01/2021   TRIG 73 10/01/2021   HDL 47 10/01/2021   CHOLHDL 2.7 10/01/2021   VLDL 15 10/01/2021   LDLCALC 64 10/01/2021    Physical Findings: AIMS:  , ,  ,  ,    CIWA:    COWS:     Musculoskeletal: Strength & Muscle Tone: within normal limits Gait & Station: normal Patient leans: N/A  Psychiatric Specialty Exam:  Presentation  General Appearance:  Appropriate for Environment; Casual  Eye Contact: Good  Speech: Clear and Coherent  Speech  Volume: Normal  Handedness: Right  Mood and Affect  Mood: Euthymic  Affect: Appropriate; Congruent  Thought Process  Thought Processes: Coherent  Descriptions of Associations:Intact  Orientation:Full (Time, Place and Person)  Thought Content:Logical; WDL  History of Schizophrenia/Schizoaffective disorder:No  Duration of Psychotic Symptoms:No data recorded Hallucinations:Hallucinations: None  Ideas of Reference:None  Suicidal Thoughts:Suicidal Thoughts: No  Homicidal Thoughts:Homicidal Thoughts: No   Sensorium  Memory: Immediate Good; Recent Good  Judgment: Fair  Insight: Fair  Art therapist  Concentration: Good  Attention Span: Good  Recall: Good  Fund of Knowledge: Fair  Language: Good  Psychomotor Activity  Psychomotor Activity: Psychomotor Activity: Normal  Assets  Assets: Communication Skills; Desire for Improvement; Housing; Physical Health; Resilience; Social Support  Sleep  Sleep: Sleep: Good Number of Hours of Sleep: 8  Physical Exam: Physical Exam Vitals and nursing note reviewed.  Constitutional:      General: She is not in acute distress.  Appearance: She is not ill-appearing or toxic-appearing.  HENT:     Head: Normocephalic.     Nose: Nose normal.  Eyes:     Extraocular Movements: Extraocular movements intact.  Cardiovascular:     Rate and Rhythm: Tachycardia present.  Pulmonary:     Effort: Pulmonary effort is normal.  Abdominal:     Comments: Deferred  Genitourinary:    Comments: Deferred Musculoskeletal:        General: Normal range of motion.     Cervical back: Normal range of motion.  Skin:    General: Skin is warm.  Neurological:     General: No focal deficit present.     Mental Status: She is alert and oriented to person, place, and time.  Psychiatric:        Mood and Affect: Mood normal.        Behavior: Behavior normal.        Thought Content: Thought content normal.    Review of  Systems  Constitutional:  Negative for chills and fever.  HENT:  Negative for sore throat.   Eyes:  Negative for blurred vision.  Respiratory:  Negative for cough, sputum production, shortness of breath and wheezing.   Cardiovascular:  Negative for chest pain and palpitations.  Gastrointestinal:  Positive for diarrhea. Negative for abdominal pain, heartburn, nausea and vomiting.  Genitourinary:  Negative for dysuria, frequency and urgency.  Musculoskeletal: Negative.   Skin:  Negative for itching and rash.  Neurological:  Negative for dizziness, tingling and headaches.  Endo/Heme/Allergies:        See allergy listing  Psychiatric/Behavioral:  Positive for depression. Negative for hallucinations, substance abuse and suicidal ideas. The patient is nervous/anxious. The patient does not have insomnia.    Blood pressure 135/75, pulse (!) 104, temperature 98 F (36.7 C), temperature source Oral, resp. rate 14, height 5\' 4"  (1.626 m), weight 54.4 kg, last menstrual period 01/10/2024, SpO2 100%. Body mass index is 20.59 kg/m.  Treatment Plan Summary: Daily contact with patient to assess and evaluate symptoms and progress in treatment and Medication management Treatment Plan Summary: Daily contact with patient to assess and evaluate symptoms and progress in treatment and Medication management Treatment Plan Summary:   Patient was admitted to the Child and adolescent  unit at [facility] under the service of Dr. Elsie Saas. Routine labs, which include CMP: Within normal limits.  Glucose: 121 elevated. CBC with differential: Within normal limits. UDS: Negative for any substances., UA: Obtained.  Pregnancy test: Negative.  Medical consultation were reviewed and routine PRN's were ordered for the patient.  Respiratory panel was negative and pending rest of the labs. Will maintain Q 15 minutes observation for safety. During this hospitalization the patient will receive psychosocial and  education assessment Patient will participate in  group, milieu, and family therapy. Psychotherapy:  Social and Doctor, hospital, anti-bullying, learning based strategies, cognitive behavioral, and family object relations individuation separation intervention psychotherapies can be considered. Medication management: Patient will be starting her home medication Remeron 30 mg p.o. daily at bedtime for depression and sleep, Strattera capsule 80 mg p.o. daily for ADHD, birth control pills LOESTRIN 1-20 MG-MCG per tab p.o. daily, Flonase 50 mcg /ACT nasal spray 1 spray each nostril 2 times daily for allergies.  Patient may take hydroxyzine 25 mg at bedtime as needed which can be repeated times once. Patient parents provided informed consent after brief brief discussion about risk and benefits.  However, mother is refusing all  medications at this time. Patient and guardian were educated about medication efficacy and side effects.  Patient not agreeable with medication trial will speak with guardian.  Will continue to monitor patient's mood and behavior. To schedule a Family meeting to obtain collateral information and discuss discharge and follow up plan.   Physician Treatment Plan for Primary Diagnosis: MDD (major depressive disorder), recurrent episode, severe (HCC) Long Term Goal(s): Improvement in symptoms so as ready for discharge   Short Term Goals: Ability to identify changes in lifestyle to reduce recurrence of condition will improve, Ability to verbalize feelings will improve, Ability to disclose and discuss suicidal ideas, Ability to demonstrate self-control will improve, Ability to identify and develop effective coping behaviors will improve, Ability to maintain clinical measurements within normal limits will improve, Compliance with prescribed medications will improve, and Ability to identify triggers associated with substance abuse/mental health issues will improve   Physician  Treatment Plan for Secondary Diagnosis: Principal Problem:   MDD (major depressive disorder), recurrent episode, severe (HCC)  Cecilie Lowers, FNP 01/21/2024, 7:14 PM

## 2024-01-21 NOTE — [facility] Group Notes (Signed)
Spiritual care group on grief and loss facilitated by Chaplain Dyanne Carrel, [facility]  Group Goal: Support / Education around grief and loss  Members engage in facilitated group support and psycho-social education.  Group Description:  Following introductions and group rules, group members engaged in facilitated group dialogue and support around topic of loss, with particular support around experiences of loss in their lives. Group Identified types of loss (relationships / self / things) and identified patterns, circumstances, and changes that precipitate losses. Reflected on thoughts / feelings around loss, normalized grief responses, and recognized variety in grief experience. Group encouraged individual reflection on safe space and on the coping skills that they are already utilizing.  Group drew on Adlerian / Rogerian and narrative framework  Patient Progress: Betty Davis attended group and actively engaged and participated in group conversation and activities.

## 2024-01-21 NOTE — Progress Notes (Addendum)
[facility] Child/Adolescent Case Management Discharge Plan :  Will you be returning to the same living situation after discharge: No. Pt will be transitioning to residential treatment at [facility] in [place]. At discharge, do you have transportation home?:Yes,  pt's mother, Betty Davis [phone] Do you have the ability to pay for your medications:Yes,  pt has insurance coverage  Release of information consent forms completed and in the chart;  Patient's signature needed at discharge.  Patient to Follow up at:  Follow-up Information     [facility]. Call on 01/19/2024.   Why: Please follow up with this provider for next steps in residential treatment. Contact information: Address: [address] [place], [place] [address] Phone: [phone]                Family Contact:  Telephone:  Spoke with:  pt's mother  Patient denies SI/HI:   Yes,  pt currently denies SI/HI     Aeronautical engineer and Suicide Prevention discussed:  Yes,  CSW completed SPE with pt's mother  Parent/caregiver will pick up patient for discharge at 4:30 PM. Patient to be discharged by RN. RN will have parent/caregiver sign release of information (ROI) forms and will be given a suicide prevention (SPE) pamphlet for reference. RN will provide discharge summary/AVS and will answer all questions regarding medications and appointments.   Cherly Hensen, LCSW 01/22/2024, 8:36 AM

## 2024-01-21 NOTE — [facility] Group Notes (Signed)
Child/Adolescent Psychoeducational Group Note  Date:  01/21/2024 Time:  8:42 PM  Group Topic/Focus:  Wrap-Up Group:   The focus of this group is to help patients review their daily goal of treatment and discuss progress on daily workbooks.  Participation Level:  Active  Participation Quality:  Appropriate  Affect:  Appropriate  Cognitive:  Appropriate  Insight:  Appropriate  Engagement in Group:  Engaged  Modes of Intervention:  Discussion  Additional Comments:      Child/Adolescent Psychoeducational Group Note  Date:  01/21/2024 Time:  8:43 PM  Group Topic/Focus:  Wrap-Up Group:   The focus of this group is to help patients review their daily goal of treatment and discuss progress on daily workbooks.  Participation Level:  Active  Participation Quality:  Appropriate  Affect:  Appropriate  Cognitive:  Appropriate  Insight:  Appropriate  Engagement in Group:  Engaged  Modes of Intervention:  Discussion  Additional Comments:  Pt attended group.  Betty Davis 01/21/2024, 8:43 PM  Betty Davis 01/21/2024, 8:42 PM

## 2024-01-22 ENCOUNTER — Other Ambulatory Visit (HOSPITAL_COMMUNITY): Payer: Self-pay

## 2024-01-22 ENCOUNTER — Other Ambulatory Visit: Payer: Self-pay

## 2024-01-22 ENCOUNTER — Other Ambulatory Visit (HOSPITAL_BASED_OUTPATIENT_CLINIC_OR_DEPARTMENT_OTHER): Payer: Self-pay

## 2024-01-22 DIAGNOSIS — Z9189 Other specified personal risk factors, not elsewhere classified: Secondary | ICD-10-CM

## 2024-01-22 DIAGNOSIS — F332 Major depressive disorder, recurrent severe without psychotic features: Secondary | ICD-10-CM | POA: Diagnosis not present

## 2024-01-22 MED ORDER — ATOMOXETINE HCL 80 MG PO CAPS
80.0000 mg | ORAL_CAPSULE | Freq: Every day | ORAL | 0 refills | Status: DC
  Filled 2024-01-22: qty 30, 30d supply, fill #0

## 2024-01-22 MED ORDER — MIRTAZAPINE 30 MG PO TABS
30.0000 mg | ORAL_TABLET | Freq: Every day | ORAL | 0 refills | Status: DC
  Filled 2024-01-22: qty 30, 30d supply, fill #0

## 2024-01-22 MED ORDER — HYDROCORTISONE 1 % EX CREA: TOPICAL_CREAM | Freq: Two times a day (BID) | CUTANEOUS | Status: DC

## 2024-01-22 MED ORDER — NAPHAZOLINE-GLYCERIN 0.012-0.25 % OP SOLN: 1.0000 [drp] | Freq: Four times a day (QID) | OPHTHALMIC | Status: DC | PRN

## 2024-01-22 MED ORDER — HYDROXYZINE HCL 10 MG PO TABS
10.0000 mg | ORAL_TABLET | Freq: Two times a day (BID) | ORAL | 0 refills | Status: DC | PRN
  Filled 2024-01-22: qty 30, 15d supply, fill #0

## 2024-01-22 NOTE — [facility] Group Notes (Signed)
[facility] Group Notes:  (Nursing/MHT/Case Management/Adjunct)  Date:  01/22/2024  Time:  11:09 AM  Type of Therapy:  Group Topic/ Focus: Goals Group: The focus of this group is to help patients establish daily goals to achieve during treatment and discuss how the patient can incorporate goal setting into their daily lives to aide in recovery.    Participation Level:  Active   Participation Quality:  Appropriate   Affect:  Appropriate   Cognitive:  Appropriate   Insight:  Appropriate   Engagement in Group:  Engaged   Modes of Intervention:  Discussion   Summary of Progress/Problems:   Patient attended and participated goals group today. No SI/HI. Patient's goal for today is to leave and be happy.   Betty Davis 01/22/2024, 11:09 AM

## 2024-01-22 NOTE — Plan of Care (Signed)
   Problem: Safety: Goal: Periods of time without injury will increase Outcome: Progressing

## 2024-01-22 NOTE — Progress Notes (Signed)
   01/22/24 0000  Psych Admission Type (Psych Patients Only)  Admission Status Voluntary/72 hour document signed  Psychosocial Assessment  Patient Complaints Anxiety  Eye Contact Fair  Facial Expression Anxious  Affect Anxious  Speech Logical/coherent  Interaction Assertive  Motor Activity Fidgety  Appearance/Hygiene Unremarkable  Behavior Characteristics Cooperative  Mood Anxious;Pleasant  Thought Process  Coherency WDL  Content WDL  Delusions None reported or observed  Perception WDL  Hallucination None reported or observed  Judgment Poor  Confusion None  Danger to Self  Current suicidal ideation? Denies  Agreement Not to Harm Self Yes  Description of Agreement verbal  Danger to Others  Danger to Others None reported or observed

## 2024-01-22 NOTE — Plan of Care (Signed)
Problem: Education: Goal: Knowledge of [facility] Education information/materials will improve Outcome: Adequate for Discharge Goal: Emotional status will improve Outcome: Adequate for Discharge Goal: Mental status will improve Outcome: Adequate for Discharge Goal: Verbalization of understanding the information provided will improve Outcome: Adequate for Discharge   Problem: Activity: Goal: Interest or engagement in activities will improve Outcome: Adequate for Discharge Goal: Sleeping patterns will improve Outcome: Adequate for Discharge   Problem: Coping: Goal: Ability to verbalize frustrations and anger appropriately will improve Outcome: Adequate for Discharge Goal: Ability to demonstrate self-control will improve Outcome: Adequate for Discharge   Problem: Health Behavior/Discharge Planning: Goal: Identification of resources available to assist in meeting health care needs will improve Outcome: Adequate for Discharge Goal: Compliance with treatment plan for underlying cause of condition will improve Outcome: Adequate for Discharge   Problem: Physical Regulation: Goal: Ability to maintain clinical measurements within normal limits will improve Outcome: Adequate for Discharge   Problem: Safety: Goal: Periods of time without injury will increase Outcome: Adequate for Discharge   Problem: Education: Goal: Utilization of techniques to improve thought processes will improve Outcome: Adequate for Discharge Goal: Knowledge of the prescribed therapeutic regimen will improve Outcome: Adequate for Discharge   Problem: Activity: Goal: Interest or engagement in leisure activities will improve Outcome: Adequate for Discharge Goal: Imbalance in normal sleep/wake cycle will improve Outcome: Adequate for Discharge   Problem: Coping: Goal: Coping ability will improve Outcome: Adequate for Discharge Goal: Will verbalize feelings Outcome: Adequate for Discharge    Problem: Health Behavior/Discharge Planning: Goal: Ability to make decisions will improve Outcome: Adequate for Discharge Goal: Compliance with therapeutic regimen will improve Outcome: Adequate for Discharge   Problem: Role Relationship: Goal: Will demonstrate positive changes in social behaviors and relationships Outcome: Adequate for Discharge   Problem: Safety: Goal: Ability to disclose and discuss suicidal ideas will improve Outcome: Adequate for Discharge Goal: Ability to identify and utilize support systems that promote safety will improve Outcome: Adequate for Discharge   Problem: Self-Concept: Goal: Will verbalize positive feelings about self Outcome: Adequate for Discharge Goal: Level of anxiety will decrease Outcome: Adequate for Discharge   Problem: Education: Goal: Ability to incorporate positive changes in behavior to improve self-esteem will improve Outcome: Adequate for Discharge   Problem: Health Behavior/Discharge Planning: Goal: Ability to identify and utilize available resources and services will improve Outcome: Adequate for Discharge Goal: Ability to remain free from injury will improve Outcome: Adequate for Discharge   Problem: Self-Concept: Goal: Will verbalize positive feelings about self Outcome: Adequate for Discharge   Problem: Skin Integrity: Goal: Demonstration of wound healing without infection will improve Outcome: Adequate for Discharge   Problem: Education: Goal: Ability to make informed decisions regarding treatment will improve Outcome: Adequate for Discharge   Problem: Coping: Goal: Coping ability will improve Outcome: Adequate for Discharge   Problem: Health Behavior/Discharge Planning: Goal: Identification of resources available to assist in meeting health care needs will improve Outcome: Adequate for Discharge   Problem: Medication: Goal: Compliance with prescribed medication regimen will improve Outcome: Adequate for  Discharge   Problem: Self-Concept: Goal: Ability to disclose and discuss suicidal ideas will improve Outcome: Adequate for Discharge Goal: Will verbalize positive feelings about self Outcome: Adequate for Discharge Note: Patient is on track. Patient will maintain adherence    Problem: Education: Goal: Ability to state activities that reduce stress will improve Outcome: Adequate for Discharge   Problem: Coping: Goal: Ability to identify and develop effective coping behavior will improve Outcome:  Adequate for Discharge   Problem: Self-Concept: Goal: Ability to identify factors that promote anxiety will improve Outcome: Adequate for Discharge Goal: Level of anxiety will decrease Outcome: Adequate for Discharge Goal: Ability to modify response to factors that promote anxiety will improve Outcome: Adequate for Discharge

## 2024-01-22 NOTE — Progress Notes (Signed)
Pt discharged today. Pt left facility with her mother. Pt removed all belongings and pt and mother verbalized understanding of medications and discharge instructions. Pt denies SI/HI/AVH.

## 2024-01-22 NOTE — [facility] Suicide Risk Assessment (Signed)
[facility] Discharge Suicide Risk Assessment   Principal Problem: MDD (major depressive disorder), recurrent episode, severe (HCC) Discharge Diagnoses: Principal Problem:   MDD (major depressive disorder), recurrent episode, severe (HCC)   Total Time spent with patient: 15 minutes  Musculoskeletal: Strength & Muscle Tone: within normal limits Gait & Station: normal Patient leans: N/A  Psychiatric Specialty Exam  Presentation  General Appearance:  Appropriate for Environment; Casual  Eye Contact: Good  Speech: Clear and Coherent  Speech Volume: Normal  Handedness: Right   Mood and Affect  Mood: Euthymic  Duration of Depression Symptoms: N/A  Affect: Appropriate; Congruent   Thought Process  Thought Processes: Coherent; Goal Directed  Descriptions of Associations:Intact  Orientation:Full (Time, Place and Person)  Thought Content:Logical  History of Schizophrenia/Schizoaffective disorder:No  Duration of Psychotic Symptoms:No data recorded Hallucinations:Hallucinations: None  Ideas of Reference:None  Suicidal Thoughts:Suicidal Thoughts: No  Homicidal Thoughts:Homicidal Thoughts: No   Sensorium  Memory: Immediate Good; Recent Fair; Remote Fair  Judgment: Intact  Insight: Present   Executive Functions  Concentration: Good  Attention Span: Good  Recall: Good  Fund of Knowledge: Good  Language: Good   Psychomotor Activity  Psychomotor Activity: Psychomotor Activity: Normal   Assets  Assets: Communication Skills; Resilience; Social Support; Desire for Improvement; Talents/Skills; Transportation; Financial Resources/Insurance; Vocational/Educational; Housing; Leisure Time; Physical Health   Sleep  Sleep: Sleep: Good Number of Hours of Sleep: 9   Physical Exam: Physical Exam ROS Blood pressure 137/73, pulse 90, temperature 98.9 F (37.2 C), temperature source Oral, resp. rate 14, height 5\' 4"  (1.626 m), weight 54.4 kg,  last menstrual period 01/10/2024, SpO2 100%. Body mass index is 20.59 kg/m.  Mental Status Per Nursing Assessment::   On Admission:  Suicidal ideation indicated by patient, Self-harm thoughts  Demographic Factors:  Adolescent or young adult  Loss Factors: NA  Historical Factors: NA  Risk Reduction Factors:   Sense of responsibility to family, Religious beliefs about death, Living with another person, especially a relative, Positive social support, Positive therapeutic relationship, and Positive coping skills or problem solving skills  Continued Clinical Symptoms:  Severe Anxiety and/or Agitation Depression:   Recent sense of peace/wellbeing More than one psychiatric diagnosis Previous Psychiatric Diagnoses and Treatments  Cognitive Features That Contribute To Risk:  Polarized thinking    Suicide Risk:  Minimal: No identifiable suicidal ideation.  Patients presenting with no risk factors but with morbid ruminations; may be classified as minimal risk based on the severity of the depressive symptoms   Follow-up Information     [facility]. Call on 01/19/2024.   Why: Please follow up with this provider for next steps in residential treatment. Contact information: Address: [address] [place], [place] [address] Phone: [phone]                Plan Of Care/Follow-up recommendations:  Activity:  As tolerated Diet:  Regular  Leata Mouse, MD 01/22/2024, 12:39 PM

## 2024-01-22 NOTE — Progress Notes (Signed)
   01/22/24 0900  Psych Admission Type (Psych Patients Only)  Admission Status Voluntary/72 hour document signed  Psychosocial Assessment  Patient Complaints None  Eye Contact Fair  Facial Expression Anxious  Affect Anxious  Speech Logical/coherent  Interaction Assertive  Motor Activity Fidgety  Appearance/Hygiene Unremarkable  Behavior Characteristics Cooperative  Mood Pleasant  Thought Process  Coherency WDL  Content WDL  Delusions None reported or observed  Perception WDL  Hallucination None reported or observed  Judgment Limited  Confusion None  Danger to Self  Current suicidal ideation? Denies  Agreement Not to Harm Self Yes  Description of Agreement verbal  Danger to Others  Danger to Others None reported or observed   Pt denies suicidal thoughts. Pt reports she slept through breakfast and did not go to cafeteria. Breakfast tray provided to pt.
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
## 2024-01-25 DIAGNOSIS — F332 Major depressive disorder, recurrent severe without psychotic features: Secondary | ICD-10-CM | POA: Diagnosis not present

## 2024-01-25 DIAGNOSIS — F411 Generalized anxiety disorder: Secondary | ICD-10-CM | POA: Diagnosis not present

## 2024-01-26 DIAGNOSIS — F332 Major depressive disorder, recurrent severe without psychotic features: Secondary | ICD-10-CM | POA: Diagnosis not present

## 2024-01-26 DIAGNOSIS — F411 Generalized anxiety disorder: Secondary | ICD-10-CM | POA: Diagnosis not present

## 2024-01-27 ENCOUNTER — Other Ambulatory Visit: Payer: Self-pay

## 2024-01-27 ENCOUNTER — Other Ambulatory Visit (HOSPITAL_COMMUNITY): Payer: Self-pay

## 2024-01-27 DIAGNOSIS — F411 Generalized anxiety disorder: Secondary | ICD-10-CM | POA: Diagnosis not present

## 2024-01-27 DIAGNOSIS — F332 Major depressive disorder, recurrent severe without psychotic features: Secondary | ICD-10-CM | POA: Diagnosis not present

## 2024-01-27 DIAGNOSIS — R5383 Other fatigue: Secondary | ICD-10-CM | POA: Diagnosis not present

## 2024-01-28 DIAGNOSIS — F332 Major depressive disorder, recurrent severe without psychotic features: Secondary | ICD-10-CM | POA: Diagnosis not present

## 2024-01-28 DIAGNOSIS — F411 Generalized anxiety disorder: Secondary | ICD-10-CM | POA: Diagnosis not present

## 2024-01-29 DIAGNOSIS — F332 Major depressive disorder, recurrent severe without psychotic features: Secondary | ICD-10-CM | POA: Diagnosis not present

## 2024-01-29 DIAGNOSIS — F411 Generalized anxiety disorder: Secondary | ICD-10-CM | POA: Diagnosis not present

## 2024-02-01 ENCOUNTER — Other Ambulatory Visit (HOSPITAL_COMMUNITY): Payer: Self-pay

## 2024-02-01 DIAGNOSIS — F332 Major depressive disorder, recurrent severe without psychotic features: Secondary | ICD-10-CM | POA: Diagnosis not present

## 2024-02-01 DIAGNOSIS — F411 Generalized anxiety disorder: Secondary | ICD-10-CM | POA: Diagnosis not present

## 2024-02-02 ENCOUNTER — Other Ambulatory Visit (HOSPITAL_COMMUNITY): Payer: Self-pay

## 2024-02-02 DIAGNOSIS — F411 Generalized anxiety disorder: Secondary | ICD-10-CM | POA: Diagnosis not present

## 2024-02-02 DIAGNOSIS — F332 Major depressive disorder, recurrent severe without psychotic features: Secondary | ICD-10-CM | POA: Diagnosis not present

## 2024-02-03 DIAGNOSIS — F411 Generalized anxiety disorder: Secondary | ICD-10-CM | POA: Diagnosis not present

## 2024-02-03 DIAGNOSIS — F332 Major depressive disorder, recurrent severe without psychotic features: Secondary | ICD-10-CM | POA: Diagnosis not present

## 2024-02-04 DIAGNOSIS — F332 Major depressive disorder, recurrent severe without psychotic features: Secondary | ICD-10-CM | POA: Diagnosis not present

## 2024-02-04 DIAGNOSIS — R5383 Other fatigue: Secondary | ICD-10-CM | POA: Diagnosis not present

## 2024-02-04 DIAGNOSIS — F411 Generalized anxiety disorder: Secondary | ICD-10-CM | POA: Diagnosis not present

## 2024-02-05 ENCOUNTER — Other Ambulatory Visit (HOSPITAL_COMMUNITY): Payer: Self-pay

## 2024-02-05 ENCOUNTER — Other Ambulatory Visit: Payer: Self-pay

## 2024-02-05 DIAGNOSIS — F332 Major depressive disorder, recurrent severe without psychotic features: Secondary | ICD-10-CM | POA: Diagnosis not present

## 2024-02-05 DIAGNOSIS — F411 Generalized anxiety disorder: Secondary | ICD-10-CM | POA: Diagnosis not present

## 2024-02-05 MED ORDER — ATOMOXETINE HCL 40 MG PO CAPS
40.0000 mg | ORAL_CAPSULE | Freq: Every day | ORAL | 0 refills | Status: DC
  Filled 2024-02-05 – 2024-02-08 (×3): qty 30, 30d supply, fill #0

## 2024-02-05 MED ORDER — HYDROXYZINE PAMOATE 25 MG PO CAPS
25.0000 mg | ORAL_CAPSULE | Freq: Three times a day (TID) | ORAL | 0 refills | Status: DC | PRN
  Filled 2024-02-05 – 2024-02-08 (×3): qty 90, 30d supply, fill #0

## 2024-02-05 MED ORDER — DESVENLAFAXINE SUCCINATE ER 50 MG PO TB24
50.0000 mg | ORAL_TABLET | Freq: Every day | ORAL | 0 refills | Status: DC
  Filled 2024-02-05: qty 30, 30d supply, fill #0

## 2024-02-05 MED ORDER — NORETHIN ACE-ETH ESTRAD-FE 1-20 MG-MCG PO TABS
1.0000 | ORAL_TABLET | Freq: Every day | ORAL | 3 refills | Status: DC
Start: 2024-02-05 — End: 2024-09-06
  Filled 2024-02-05: qty 84, 84d supply, fill #0
  Filled 2024-04-15: qty 84, 84d supply, fill #1

## 2024-02-06 ENCOUNTER — Other Ambulatory Visit (HOSPITAL_COMMUNITY): Payer: Self-pay

## 2024-02-08 ENCOUNTER — Other Ambulatory Visit (HOSPITAL_COMMUNITY): Payer: Self-pay

## 2024-02-11 ENCOUNTER — Ambulatory Visit (HOSPITAL_COMMUNITY)
Admission: EM | Admit: 2024-02-11 | Discharge: 2024-02-11 | Disposition: A | Discharge status: Home / Self Care | Payer: Commercial Managed Care - PPO

## 2024-02-11 DIAGNOSIS — F332 Major depressive disorder, recurrent severe without psychotic features: Secondary | ICD-10-CM | POA: Diagnosis not present
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
## 2024-02-11 NOTE — BH Assessment (Signed)
 Comprehensive Clinical Assessment (CCA) Note  02/11/2024 [name] [id number]  DISPOSITION: Per Thurston Hole NP pt is recommended for DBT treatment in [facility] PHP program and continuing with her current OP psychiatric providers   The patient demonstrates the following risk factors for suicide: Chronic risk factors for suicide include: psychiatric disorder of MDD and GAD . Acute risk factors for suicide include: family or marital conflict, unemployment, and loss (financial, interpersonal, professional). Protective factors for this patient include: positive social support, positive therapeutic relationship, and hope for the future. Considering these factors, the overall suicide risk at this point appears to be low. Patient is appropriate for outpatient follow up.    Per Triage assessment: "Jakyria Bleau presents to [facility] voluntarily via BHRT/GPD. Pt states that her parents thinks she needs some help. Pt states that she doesn't think anything is wrong. Per BHRT, pt shared that she has been dealing with depression and feeling sad. Pt is on medication and shared that the medication keeps her from eating. Pt currently denies SI, HI, AVH and drug use. Pt admits to drinking a half can of White Claw on yesterday. "  With further assessment: Pt is an 18 yo female who presented voluntarily accompanied by the [facility] (GPD). Per pt and her mother, Oleva Koo [phone]), pt exhibited aggressive behaviors in an outburst at the home she shares with her parents. Per pt and mother, pt broke the TV and destroyed several Christmas presents her mother had received. Pt denied SI, HI, current self-harm, AVH and paranoia. Pt reported that she uses cannabis weekly and alcohol "occasionally." Per pt, she last had some cannabis edibles on this past Sunday and drank some alcohol (part of one White Claw) yesterday. Pt has an established OP therapist in [place] and has been seeing her reportedly for about  3 years. Pt had been seeing Dr. Randa Evens at [facility] for medication management but pt stated that she has not been taking her prescribed medications consistently since January, several weeks ago.   Pt has been displaying borderline traits in that she sometimes talks about suicide but denies any actual attempts, has wide variations in her moods and expressive outbursts at times, she has been struggling with several situations in which she felt abandoned in the past 3 years (two close relationships- her sister leaving home and the death of her step-grandfather) and she has been struggling with her sense of self and her future as she recently (July 2024) graduated high school and has been struggling to begin classes at [employer] with several re-starts. Pt has a hx of self-harm in the form of burning herself and superficial cutting on her thighs. Per pt and mother, pt "talks about" self-harm and suicide at times" but pt denies any attempts and mother confirms she is not aware of any. Pt has been admitted twice to Inpatient psychiatric units (February, 2022 and January 2025.) Pt was most recently discharged on 01/22/24 from [facility]. Per mother, pt has voiced a desire to be in a long-term placement and has just been discharged from a residential placement in [place]. Per mother, pt began to have mental health struggles about 3 years ago following being home from school during COVID. Per mom, when pt had to return to school, shortly thereafter, pt began to be bullied and developed anxiety, outbursts and talk of self harm.   Pt is living with her parents and has been trying to begin classes at [employer] but has had to withdraw several times due to  stress and not being able to cope. Pt is not employed currently, has never been married and has no children. Pt was dressed casually, adequately groomed and seemed alert and fully oriented. Pt's mood seemed depressed but she was also highly emotional displaying tears often during the  assessment. Pt's affect was flat which is congruent. Pt's eye contact, speech and movement were within normal limits. Pt's judgment and insight were poor.   Chief Complaint:  Chief Complaint  Patient presents with   Evaluation   Visit Diagnosis:  MDD, Recurrent, Moderate GAD Borderline traits    CCA Screening, Triage and Referral (STR)  Patient Reported Information How did you hear about [place]? Legal System  What Is the Reason for Your Visit/Call Today? Suzzane Quilter presents to [facility] voluntarily via BHRT/GPD. Pt states that her parents thinks she needs some help. Pt states that she doesn't think anything is wrong. Per BHRT, pt shared that she has been dealing with depression and feeling sad. Pt is on medication and shared that the medication keeps her from eating. Pt currently denies SI, HI, AVH and drug use. Pt admits to drinking a half can of White Claw on yesterday.  How Long Has This Been Causing You Problems? <Week  What Do You Feel Would Help You the Most Today? Social Support   Have You Recently Had Any Thoughts About Hurting Yourself? No  Are You Planning to Commit Suicide/Harm Yourself At This time? No   Flowsheet Row ED from 02/11/2024 in [facility]) [facility] from [facility]17/2024 in [facility]  C-SSRS RISK CATEGORY No Risk High Risk High Risk       Have you Recently Had Thoughts About Hurting Someone [name]? No  Are You Planning to Harm Someone at This Time? No  Explanation: n/a   Have You Used Any Alcohol or Drugs in the Past 24 Hours? Yes  How Long Ago Did You Use Drugs or Alcohol? yesterday What Did You Use and How Much? yesterday - half of a White Claw   Do You Currently Have a Therapist/Psychiatrist? Yes  Name of Therapist/Psychiatrist: Name of Therapist/Psychiatrist: [facility] for OP therapy and Dr.  Randa Evens ar [place] for medication management   Have You Been Recently Discharged From Any Office Practice or Programs? Yes  Explanation of Discharge From Practice/Program: Recently (01/22/24) discharged from [facility]     CCA Screening Triage Referral Assessment Type of Contact: Face-to-Face  Telemedicine Service Delivery:   Is this Initial or Reassessment?   Date Telepsych consult ordered in [facility]:    Time Telepsych consult ordered in [facility]:    Location of Assessment: [facility]  Provider Location: [facility] Assessment Services   Collateral Involvement: Mother, Dewana Ammirati at [phone]   Does Patient Have a Automotive engineer Guardian? No  Legal Guardian Contact Information: na  Copy of Legal Guardianship Form: -- (na)  Legal Guardian Notified of Arrival: -- (family is aware of recommendedation and disposition with pt's verbal permission)  Legal Guardian Notified of Pending Discharge: -- (na)  If Minor and Not Living with Parent(s), Who has Custody? adult  Is CPS involved or ever been involved? -- (none reported)  Is APS involved or ever been involved? -- (none reported)   Patient Determined To Be At Risk for Harm To Self or Others Based on Review of Patient Reported Information or Presenting Complaint? No  Method: No Plan  Availability of Means: No access or NA  Intent: Vague intent or NA  Notification Required: No need or identified person  Additional Information for Danger to Others Potential: -- (na)  Additional Comments for Danger to Others Potential: na  Are There Guns or Other Weapons in Your Home? No (denied access)  Types of Guns/Weapons: na  Are These Weapons Safely Secured?                            -- (na)  Who Could Verify You Are Able To Have These Secured: n/a  Do You Have any Outstanding Charges, Pending Court Dates, Parole/Probation? none reported  Contacted To Inform of Risk of Harm To Self or Others: --  (na)    Does Patient Present under Involuntary Commitment? No    [place] of Residence: [place]   Patient Currently Receiving the Following Services: Individual Therapy; Medication Management   Determination of Need: Urgent (48 hours) (Per Christal Bennett NP pt is recommended for DBT treatment in [facility] PHP program and continuing with her current OP psychiatric providers)   Options For Referral: Partial Hospitalization; Medication Management; Outpatient Therapy     CCA Biopsychosocial Patient Reported Schizophrenia/Schizoaffective Diagnosis in Past: No   Strengths: Motivated towards treatment, engaging, has support   Mental Health Symptoms Depression:  Change in energy/activity; Fatigue; Hopelessness; Tearfulness; Sleep (too much or little); Irritability   Duration of Depressive symptoms: Duration of Depressive Symptoms: Greater than two weeks   Mania:  Irritability; Recklessness   Anxiety:   Worrying; Tension   Psychosis:  None   Duration of Psychotic symptoms:    Trauma:  None   Obsessions:  None   Compulsions:  None   Inattention:  Does not seem to listen (per history)   Hyperactivity/Impulsivity:  None   Oppositional/Defiant Behaviors:  Defies rules; Angry; Argumentative; Temper   Emotional Irregularity:  Intense/inappropriate anger; Mood lability; Potentially harmful impulsivity; Frantic efforts to avoid abandonment; Recurrent suicidal behaviors/gestures/threats; Unstable self-image   Other Mood/Personality Symptoms:  None    Mental Status Exam Appearance and self-care  Stature:  Average   Weight:  Average weight   Clothing:  Casual; Neat/clean (Covered by blanket)   Grooming:  Normal   Cosmetic use:  Age appropriate   Posture/gait:  Normal   Motor activity:  Not Remarkable   Sensorium  Attention:  Normal   Concentration:  Normal   Orientation:  X5   Recall/memory:  Normal   Affect and Mood  Affect:  Depressed; Flat   Mood:   Depressed   Relating  Eye contact:  Normal   Facial expression:  Depressed   Attitude toward examiner:  Cooperative; Guarded; Dramatic   Thought and Language  Speech flow: Normal   Thought content:  Appropriate to Mood and Circumstances   Preoccupation:  None   Hallucinations:  None   Organization:  Coherent   [employer] of Knowledge:  Average   Intelligence:  Average   Abstraction:  Functional   Judgement:  Poor   Reality Testing:  Adequate   Insight:  Lacking; Poor   Decision Making:  Impulsive   Social Functioning  Social Maturity:  Impulsive   Social Judgement:  Naive   Stress  Stressors:  Grief/losses; Relationship; School   Coping Ability:  Exhausted; Overwhelmed   Skill Deficits:  Decision making; Communication; Interpersonal; Responsibility; Self-control   Supports:  Family; Friends/Service system; Support needed  Religion: Religion/Spirituality Are You A Religious Person?: Yes What is Your Religious Affiliation?: Christian How Might This Affect Treatment?: NA  Leisure/Recreation: Leisure / Recreation Do You Have Hobbies?: Yes Leisure and Hobbies: "I don't know"  Exercise/Diet: Exercise/Diet Have You Gained or Lost A Significant Amount of Weight in the Past Six Months?: No Do You Follow a Special Diet?: No Do You Have Any Trouble Sleeping?: No   CCA Employment/Education Employment/Work Situation: Employment / Work Situation Employment Situation: Unemployed Patient's Job has Been Impacted by Current Illness: Yes Describe how Patient's Job has Been Impacted: Per mother, pt has had to stop going to school several times this semester due to stress reactions Has Patient ever Been in the [employer]?: No  Education: Education Is Patient Currently Attending School?: No Last Grade Completed: 12 Did You Attend College?: No Did You Have An Individualized Education Program (IIEP): No Did You Have Any Difficulty At School?:  Yes (bullied in 9th and 10th grades) Were Any Medications Ever Prescribed For These Difficulties?: No   CCA Family/Childhood History Family and Relationship History: Family history Marital status: Single Does patient have children?: No  Childhood History:  Childhood History By whom was/is the patient raised?: Both parents Did patient suffer any verbal/emotional/physical/sexual abuse as a child?: No Has patient ever been sexually abused/assaulted/raped as an adolescent or adult?: No Witnessed domestic violence?: No Has patient been affected by domestic violence as an adult?: No       CCA Substance Use Alcohol/Drug Use: Alcohol / Drug Use Pain Medications: See MAR Prescriptions: See MAR Over the Counter: See MAR History of alcohol / drug use?: Yes Longest period of sobriety (when/how long): NA Negative Consequences of Use:  (none reported) Withdrawal Symptoms:  (none reported) Substance #1 Name of Substance 1: cannabis edibles 1 - Age of First Use: 17 1 - Amount (size/oz): varies 1 - Frequency: weekly 1 - Duration: ongoing 1 - Last Use / Amount: 4 days ago 1 - Method of Aquiring: unknown 1- Route of Use: oral Substance #2 Name of Substance 2: alcohol 2 - Age of First Use: teen 2 - Amount (size/oz): part of a white claw beverage 2 - Frequency: "occasionally" 2 - Duration: ongoing 2 - Last Use / Amount: yesterday 2 - Method of Aquiring: unknown 2 - Route of Substance Use: drink, oral                     ASAM's:  Six Dimensions of Multidimensional Assessment  Dimension 1:  Acute Intoxication and/or Withdrawal Potential:   Dimension 1:  Description of individual's past and current experiences of substance use and withdrawal: none reported  Dimension 2:  Biomedical Conditions and Complications:   Dimension 2:  Description of patient's biomedical conditions and  complications: none reported  Dimension 3:  Emotional, Behavioral, or Cognitive Conditions and  Complications:  Dimension 3:  Description of emotional, behavioral, or cognitive conditions and complications: Hx of MDD, GAD and borderline traits  Dimension 4:  Readiness to Change:  Dimension 4:  Description of Readiness to Change criteria: willing to go for treatment  Dimension 5:  Relapse, Continued use, or Continued Problem Potential:  Dimension 5:  Relapse, continued use, or continued problem potential critiera description: none reported  Dimension 6:  Recovery/Living Environment:  Dimension 6:  Recovery/Iiving environment criteria description: supportive environment at home with parents  ASAM Severity Score: ASAM's Severity Rating Score: 5  ASAM Recommended Level of Treatment: ASAM Recommended Level of Treatment: Level I Outpatient  Treatment (n/a)   Substance use Disorder (SUD) Substance Use Disorder (SUD)  Checklist Symptoms of Substance Use: Continued use despite having a persistent/recurrent physical/psychological problem caused/exacerbated by use, Continued use despite persistent or recurrent social, interpersonal problems, caused or exacerbated by use, Recurrent use that results in a failure to fulfill major role obligations (work, school, home) (n/a)  Recommendations for Services/Supports/Treatments: Recommendations for Services/Supports/Treatments Recommendations For Services/Supports/Treatments: Individual Therapy, Medication Management, IOP (Intensive Outpatient Program)  Disposition Recommendation per psychiatric provider: There are no psychiatric contraindications to discharge at this time. Recommend immediate involvement in a PHP DBT program. TOC working on appointment to initiate.    DSM5 Diagnoses: Patient Active Problem List   Diagnosis Date Noted   At risk for self injurious behavior 01/22/2024   Behavior problem in child 02/17/2022   MDD (major depressive disorder), recurrent severe, without psychosis (HCC) 09/30/2021   ADHD, hyperactive-impulsive type 07/25/2018      Referrals to Alternative Service(s): Referred to Alternative Service(s):   Place:   Date:   Time:    Referred to Alternative Service(s):   Place:   Date:   Time:    Referred to Alternative Service(s):   Place:   Date:   Time:    Referred to Alternative Service(s):   Place:   Date:   Time:     Moet Mikulski T, Counselor

## 2024-02-11 NOTE — Discharge Instructions (Addendum)
 Based on the information that you have provided and the presenting issues outpatient services and resources for have been recommended.  It is imperative that you follow through with treatment recommendations within 5-7 days from the of discharge to mitigate further risk to your safety and mental well-being. A list of referrals has been provided below to get you started.  You are not limited to the list provided.  In case of an urgent crisis, you may contact the Mobile Crisis Unit with [facility] at [phone].   DBT Therapists     [facility]MEd, NCC[facility] L[facility] [address], [address] [place], [place] [address]  [phone] I take a humanistic view in therapy and use a variety of approaches: Cognitive-Behavioral Therapy, Psychodynamic Therapy, Dialectical Behavior Therapy, Play therapy for children, and Positive Psychology, for example . .. assuming that all people can change, grow and take responsibility for their decisions and actions despite difficulties they have faced My goal is to help my clients learn to become confident and happy. I believe that therapy can ultimately offer peace within oneself and faith in one's ability to cope with the stresses in life if the client is willing to work for it and be open to it. Due to the pandemic I will also be providing Telehealth and Telephone visits as needed.     [facility] [place], [place] [address] [phone] Pro LGBTQQIAP/ BLM/ sex/ body positive/ally. I specialize in healing from healing the neurological response to trauma. My training, both academic and visceral experiences, have all contributed in creating an eclectic therapeutic approach that values empowerment, self-direction, confidentiality, a non-judgmental atmosphere, emotional safety, validation, dignity, and respect above as else. These components are necessary to create an environment conducive for healing. When both the client and the  therapist are willing to work in collaboration towards achieving their therapeutic goals, great strides can be made in the healing process. I am a Radio broadcast assistant. I have specialized training in Dialectical Behavioral Therapy/Advanced DBT, Cognitive Behavioral Therapy, Ethics When Working with Minors, Empowerment Model, Strength Based Perspective, Recovery from Addictive Illnesses, Exposure/Impact of Domestic Violence on Children, Early Onset Sexual Trauma, and Forensic Screening. Our purpose is to treat our clients in the same way in which we want to be treated. We highly value our client's ethnic/cultural/gender/sexual diversity and appreciate the fact that gender and sexuality exists on a spectrum. Self direction is sacred. We should ALL feel appreciated for exactly who we are.     Irven Baltimore [facility], LCAS [address], [address] [place], [place] [address]  [phone] My counseling approach includes cognitive-behavioral therapy (CBT) and dialectical behavior therapy (DBT). CBT challenges your thinking patterns and how these can influence your emotions and behaviors. DBT guides you to develop mindfulness skills, distress tolerance skills, interpersonal effectiveness skills and emotional regulation skills My goal is to create a safe and comfortable environment, which may help foster growth in self-awareness and help you feel empowered in the therapeutic process. I believe for you to make lasting progress in therapy, it is important to establish a strong therapeutic relationship through genuine understanding, empathy and acceptance.     Doran Clay, LCSWA [address] [place], [place] [address]  [phone] My specialties include anxiety, depression, behavioral issues, grief/loss, self-esteem, transitions in life, and life stressors. I also utilize cognitive behavioral therapy, solution-focused therapy, motivational interviewing, dialectical  behavioral therapy, and acceptance and commitment therapy to help clients meet their goals. I understand  that life can be unpredictable, but I am here and willing to help you navigate through it. It would be my pleasure to assist you in any way possible on your journey through life.     [facility] Simona Huh, [facility] [address] [place], [place] [address]  [phone]  [facility] [address] [place], [place] [address]   Cassie's counseling approach is characterized by a combination of honesty, empathy, and compassion, complemented by a subtle touch of humor. She employs an eclectic counseling methodology that encompasses various therapeutic modalities, including Cognitive-Behavioral Therapy (CBT), Trauma-Focused Cognitive-Behavioral Therapy (TF-CBT), Dialectical Behavior Therapy (DBT), Gestalt therapy, Play Therapy, and Solution-Focused therapy.      Based on the information that you have provided and the presenting issues outpatient services and resources for have been recommended.  It is imperative that you follow through with treatment recommendations within 5-7 days from the of discharge to mitigate further risk to your safety and mental well-being. A list of referrals has been provided below to get you started.  You are not limited to the list provided.  In case of an urgent crisis, you may contact the Mobile Crisis Unit with [facility] at [phone].      PHP - Partial Hospitalization Program in [facility] [address] [place], [place] [address]  [phone]   [facility] [address] [place], [place] [address]  [phone]   [facility]  [address] [place], [place] [address]  [phone]   [facility] [address] [place], [place] [address]  [phone]

## 2024-02-11 NOTE — Discharge Summary (Signed)
 Betty Davis to be D/C'd Home per NP order. Discussed with the patient's mom and all questions fully answered. An After Visit Summary was printed and given to the patient's mom. Patient escorted out and D/C home via private auto.  Betty Davis  02/11/2024 5:09 PM

## 2024-02-11 NOTE — Care Management (Addendum)
 OBS Care Management   Writer provided patient with PHP resources

## 2024-02-11 NOTE — ED Provider Notes (Signed)
 [facility] Medical Screening Exam  Patient Name: Betty [facility] MRN: [id number] Date of Evaluation: 02/11/24 Chief Complaint:  " I honestly don't know. My dad sent me here." Diagnosis:  Final diagnoses:  None    History of Present illness: Betty [facility] is a 18 y.o. female, with a past psychiatric history significant of ADHD, GAD and MDD, presented to the [facility] voluntarily via [facility]. She reported that her parents believe she needs help, though she personally does not feel anything is wrong.  She reportedly expressed to [facility] that she has been dealing with depression and sadness.  Chart review revealed the patient was [facility] from [facility] on January 22, 2024.  Patient seen face-to-face by this provider and chart reviewed on 02/11/2024.  On evaluation, the patient stated she has been struggling with depression and persistent sadness. She stated while she is on medication it suppresses her appetite. She expressed confusion about the reason for her visit, stating that her father sent her here. She endorsed symptoms of anxiety and reported experiencing a panic attack earlier in the day, triggered by an argument with her father. She stated during the episode, she was crying and shaking. She denied active suicidal ideation, self-harm urges, homicidal ideation, auditory or visual hallucinations, paranoia, and delusional thinking. She acknowledged recent destructive behavior, including breaking a television and damaging several items belonging to her mother.  She reported having an outpatient psychiatrist and therapist but admitted to inconsistent adherence to her medication regimen, last taken her prescribed medication several weeks ago.  She stated she has been seeing [facility] (therapist) for approximately 3 years and was previously under the care of [facility] (psychiatrist) at [facility] for medication  management.  She denied access to firearm. She denied past suicide attempts or self-harming behaviors. She reported weekly cannabis use and occasional alcohol consumption. She stated last cannabis use, of edibles was this past Sunday.  Last alcohol use, part of a [place] the previous day.  She stated she currently resides with her parents. She reported she is currently enrolled in classes at [facility]. She stated that her grades are not that good due to stress and difficulty coping. She reported being unemployed, never married, and has no children. ----------------------------------------------------------------------  Collateral: The patient gives consent to speak with her mother, [facility] [phone]). This Chartered loss adjuster, along with counselor [facility], conducted a phone interview with her.  The patient's mother describes an escalation in aggressive behavior this morning, leading to an outburst at home. During this episode, the patient broke a television and destroyed several presents presents belonging to her mother. The mother confirmed that the patient is engaged in outpatient treatment with therapist [facility] and a psychiatrist with [facility]. She reported that  the patient occasionally talks about suicidal ideation but has never made an attempt to her knowledge. She noted that the patient's mood fluctuates significantly, with expressive emotional outburst.   The mother identified several stressors contributing to the patient struggles, including feelings of abandonment over the past 3 years, stemming from her older sister leaving home and the recent death of her step-grandfather; ongoing difficulty with her sense of self and uncertainty about the future after recently graduating high school; repeated attempts to enroll at [employer] [facility] [employer], with multiple withdrawals due to stress and difficulty coping.  The mother confirmed that the patient has a history of 2  psychiatric hospitalizations  (February 2022 and January 2025), with the most  recent discharge from [facility] on [facility], [facility].  She also reported that the patient has expressed desire for long-term placement and was recently discharged for a residential facility in [place]. The mother reported that the patient's mental [facility] struggles worsened approximately 3 years ago, coinciding with being home during the COVID-19 pandemic. She reported upon returning to school, the patient experienced bullying, which led to increased anxiety, emotional outbursts, and discussions of self-harm.   ---------------------------------------------------------------------- Case discussed with the attending psychiatrist, [facility].   The patient acknowledges characteristics consistent with borderline personality disorder, including threats of self-harm, fear of abandonment, unstable relationships, impulsivity, intense mood swings, frequent anger outbursts, chronic feelings of emptiness. The clinical presentation was reviewed with both the patient and her mother. Further discussed Dialectical Behavioral Therapy (DBT), a partial hospitalization program, and family therapy as recommended treatment options. Both the patient and her other verbalized understanding of the information provided.       Flowsheet Row ED from 02/11/2024 in [facility] Admission (Discharged) from [facility]18[facility]2024 [facility] ED from 01/17/2024 in [facility]  C-SSRS RISK CATEGORY No Risk High Risk High Risk       Psychiatric Specialty Exam  Presentation  General Appearance:Appropriate for Environment; Casual  Eye Contact:Good  Speech:Clear and Coherent  Speech Volume:Normal  Handedness:Right   Mood and Affect  Mood: Euthymic  Affect: Appropriate; Congruent   Thought Process  Thought  Processes: Coherent; Goal Directed  Descriptions of Associations:Intact  Orientation:Full (Time, Place and Person)  Thought Content:Logical  Diagnosis of Schizophrenia or Schizoaffective disorder in past: No   Hallucinations:None  Ideas of Reference:None  Suicidal Thoughts:No  Homicidal Thoughts:No   Sensorium  Memory: Immediate Good; Recent Fair; Remote Fair  Judgment: Intact  Insight: Present   Executive Functions  Concentration: Good  Attention Span: Good  Recall: Good  Fund of Knowledge: Good  Language: Good   Psychomotor Activity  Psychomotor Activity: Normal   Assets  Assets: Communication Skills; Resilience; Social Support; Desire for Improvement; Talents/Skills; Transportation; Financial Resources/Insurance; Vocational/Educational; Housing; Leisure Time; Physical [facility]   Sleep  Sleep: Good  Number of hours:  9   Physical Exam: Physical Exam Vitals reviewed.  Constitutional:      General: She is not in acute distress.    Appearance: Normal appearance. She is normal weight. She is not ill-appearing.  HENT:     Head: Normocephalic.     Nose: Nose normal.     Mouth/Throat:     Mouth: Mucous membranes are moist.     Pharynx: Oropharynx is clear.  Cardiovascular:     Rate and Rhythm: Normal rate.     Pulses: Normal pulses.  Pulmonary:     Effort: Pulmonary effort is normal.  Musculoskeletal:        General: Normal range of motion.     Cervical back: Normal range of motion.  Skin:    General: Skin is dry.  Neurological:     General: No focal deficit present.     Mental Status: She is alert and oriented to person, place, and time.    Review of Systems  Constitutional: Negative.   Respiratory: Negative.    Cardiovascular:  Negative for chest pain and palpitations.  Neurological:  Negative for dizziness, tremors, loss of consciousness and headaches.  Psychiatric/Behavioral:  Positive for depression (Stable for outpatient  managememt) and substance abuse (THC). Negative for hallucinations and suicidal ideas. The  patient is nervous/anxious (Stable for outpatient managememt).    Blood pressure 133/82, pulse 78, temperature 98.6 F (37 C), temperature source Oral, resp. rate 16, last menstrual period 01/10/2024, SpO2 100%. There is no height or weight on file to calculate BMI.  Musculoskeletal: Strength & Muscle Tone: within normal limits Gait & Station: normal Patient leans: N/A     [facility] MSE Discharge Disposition for Follow up and Recommendations:  Based on my evaluation the patient does not appear to have an emergency medical condition and can be discharged with resources and follow up care in outpatient services for Medication Management, Individual Therapy, Group Therapy, and DBT.   Based on the information that you have provided and the presenting issues outpatient services and resources for have been recommended.  It is imperative that you follow through with treatment recommendations within 5-7 days from the date of discharge to mitigate further risk to your safety and mental well-being. A list of referrals has been provided below to get you started.  You are not limited to the list provided.  In case of an urgent crisis, you may contact the Mobile Crisis Unit with [facility] at [phone].    [facility]  [facility]MEd, NCC, L[facility] [address] [place], [place] [address]  [phone]  I take a humanistic view in therapy and use a variety of approaches: Cognitive-Behavioral Therapy, Psychodynamic Therapy, Dialectical Behavior Therapy, Play therapy for children, and Positive Psychology, for example . .. assuming that all people can change, grow and take responsibility for their decisions and actions despite difficulties they have faced  My goal is to help my clients learn to become confident and happy. I believe that therapy can ultimately offer peace  within oneself and faith in one's ability to cope with the stresses in life if the client is willing to work for it and be open to it. Due to the pandemic I will also be providing Telehealth and Telephone visits as needed.    [facility] [place], [place] [address] [phone]  Pro LGBTQQIAP/ BLM/ sex/ body positive/ally. I specialize in healing from healing the neurological response to trauma. My training, both academic and visceral experiences, have all contributed in creating an eclectic therapeutic approach that values empowerment, self-direction, confidentiality, a non-judgmental atmosphere, emotional safety, validation, dignity, and respect above as else. These components are necessary to create an environment conducive for healing. When both the client and the therapist are willing to work in collaboration towards achieving their therapeutic goals, great strides can be made in the healing process.  I am a Radio broadcast assistant. I have specialized training in Dialectical Behavioral Therapy/Advanced DBT, Cognitive Behavioral Therapy, Ethics When Working with Minors, Empowerment Model, Strength Based Perspective, Recovery from Addictive Illnesses, Exposure/Impact of Domestic Violence on Children, Early Onset Sexual Trauma, and Forensic Screening. Our purpose is to treat our clients in the same way in which we want to be treated. We highly value our client's ethnic/cultural/gender/sexual diversity and appreciate the fact that gender and sexuality exists on a spectrum. Self direction is sacred. We should ALL feel appreciated for exactly who we are.    Irven Baltimore [facility], LCAS [address] [place], [place] [address]  [phone]  My counseling approach includes cognitive-behavioral therapy (CBT) and dialectical behavior therapy (DBT). CBT challenges your thinking patterns and how these can influence your emotions and behaviors. DBT guides you to  develop mindfulness skills, distress tolerance skills, interpersonal effectiveness skills  and emotional regulation skills  My goal is to create a safe and comfortable environment, which may help foster growth in self-awareness and help you feel empowered in the therapeutic process. I believe for you to make lasting progress in therapy, it is important to establish a strong therapeutic relationship through genuine understanding, empathy and acceptance.    Doran Clay, LCSWA [address] [place], [place] [address]  [phone]  My specialties include anxiety, depression, behavioral issues, grief/loss, self-esteem, transitions in life, and life stressors. I also utilize cognitive behavioral therapy, solution-focused therapy, motivational interviewing, dialectical behavioral therapy, and acceptance and commitment therapy to help clients meet their goals.  I understand that life can be unpredictable, but I am here and willing to help you navigate through it. It would be my pleasure to assist you in any way possible on your journey through life.    [facility] Simona Huh, [facility] [address] [place], [place] [address]  [phone]  [facility] [address] [place], [place] [address]   Cassie's counseling approach is characterized by a combination of honesty, empathy, and compassion, complemented by a subtle touch of humor. She employs an eclectic counseling methodology that encompasses various therapeutic modalities, including Cognitive-Behavioral Therapy (CBT), Trauma-Focused Cognitive-Behavioral Therapy (TF-CBT), Dialectical Behavior Therapy (DBT), Gestalt therapy, Play Therapy, and Solution-Focused therapy.   PHP - Partial Hospitalization Programs in [facility] [address] [place], [place] [address]  [phone]   [facility] [address] [place], [place] [address]   [phone]   [facility]  [address] [place], [place] [address]  [phone]   [facility] [address] [place], [place] [address]  [phone]       Norma Fredrickson, NP 02/11/2024, 4:22 PM

## 2024-02-19 DIAGNOSIS — Z131 Encounter for screening for diabetes mellitus: Secondary | ICD-10-CM | POA: Diagnosis not present

## 2024-02-19 DIAGNOSIS — Z130 Encounter for screening for diseases of the blood and blood-forming organs and certain disorders involving the immune mechanism: Secondary | ICD-10-CM | POA: Diagnosis not present

## 2024-02-19 DIAGNOSIS — Z0000 Encounter for general adult medical examination without abnormal findings: Secondary | ICD-10-CM | POA: Diagnosis not present

## 2024-02-19 DIAGNOSIS — Z13220 Encounter for screening for lipoid disorders: Secondary | ICD-10-CM | POA: Diagnosis not present

## 2024-02-24 DIAGNOSIS — F332 Major depressive disorder, recurrent severe without psychotic features: Secondary | ICD-10-CM | POA: Diagnosis not present

## 2024-03-03 DIAGNOSIS — F332 Major depressive disorder, recurrent severe without psychotic features: Secondary | ICD-10-CM | POA: Diagnosis not present

## 2024-03-04 DIAGNOSIS — F332 Major depressive disorder, recurrent severe without psychotic features: Secondary | ICD-10-CM | POA: Diagnosis not present

## 2024-03-07 ENCOUNTER — Other Ambulatory Visit (HOSPITAL_COMMUNITY): Payer: Self-pay

## 2024-03-09 DIAGNOSIS — F332 Major depressive disorder, recurrent severe without psychotic features: Secondary | ICD-10-CM | POA: Diagnosis not present

## 2024-03-10 DIAGNOSIS — F332 Major depressive disorder, recurrent severe without psychotic features: Secondary | ICD-10-CM | POA: Diagnosis not present

## 2024-03-11 ENCOUNTER — Other Ambulatory Visit (HOSPITAL_COMMUNITY): Payer: Self-pay

## 2024-03-11 DIAGNOSIS — F332 Major depressive disorder, recurrent severe without psychotic features: Secondary | ICD-10-CM | POA: Diagnosis not present

## 2024-03-11 MED ORDER — ATOMOXETINE HCL 40 MG PO CAPS
40.0000 mg | ORAL_CAPSULE | Freq: Every morning | ORAL | 3 refills | Status: DC
  Filled 2024-03-11: qty 90, 90d supply, fill #0
  Filled 2024-05-31: qty 90, 90d supply, fill #1
  Filled 2024-09-03 – 2024-09-06 (×2): qty 90, 90d supply, fill #2
  Filled 2024-12-20: qty 90, 90d supply, fill #3

## 2024-03-12 ENCOUNTER — Other Ambulatory Visit (HOSPITAL_COMMUNITY): Payer: Self-pay

## 2024-03-14 DIAGNOSIS — F332 Major depressive disorder, recurrent severe without psychotic features: Secondary | ICD-10-CM | POA: Diagnosis not present

## 2024-03-16 DIAGNOSIS — F332 Major depressive disorder, recurrent severe without psychotic features: Secondary | ICD-10-CM | POA: Diagnosis not present

## 2024-03-17 DIAGNOSIS — F332 Major depressive disorder, recurrent severe without psychotic features: Secondary | ICD-10-CM | POA: Diagnosis not present

## 2024-03-18 DIAGNOSIS — F332 Major depressive disorder, recurrent severe without psychotic features: Secondary | ICD-10-CM | POA: Diagnosis not present

## 2024-03-28 ENCOUNTER — Other Ambulatory Visit (HOSPITAL_COMMUNITY): Payer: Self-pay

## 2024-03-28 DIAGNOSIS — Z6820 Body mass index (BMI) 20.0-20.9, adult: Secondary | ICD-10-CM | POA: Diagnosis not present

## 2024-03-28 DIAGNOSIS — H60331 Swimmer's ear, right ear: Secondary | ICD-10-CM | POA: Diagnosis not present

## 2024-03-28 MED ORDER — CIPROFLOXACIN-DEXAMETHASONE 0.3-0.1 % OT SUSP
4.0000 [drp] | Freq: Two times a day (BID) | OTIC | 0 refills | Status: DC
  Filled 2024-03-28: qty 7.5, 7d supply, fill #0

## 2024-03-30 DIAGNOSIS — F332 Major depressive disorder, recurrent severe without psychotic features: Secondary | ICD-10-CM | POA: Diagnosis not present

## 2024-03-31 DIAGNOSIS — F332 Major depressive disorder, recurrent severe without psychotic features: Secondary | ICD-10-CM | POA: Diagnosis not present

## 2024-04-01 DIAGNOSIS — F332 Major depressive disorder, recurrent severe without psychotic features: Secondary | ICD-10-CM | POA: Diagnosis not present

## 2024-04-04 DIAGNOSIS — F332 Major depressive disorder, recurrent severe without psychotic features: Secondary | ICD-10-CM | POA: Diagnosis not present

## 2024-04-05 DIAGNOSIS — F332 Major depressive disorder, recurrent severe without psychotic features: Secondary | ICD-10-CM | POA: Diagnosis not present

## 2024-04-06 DIAGNOSIS — F332 Major depressive disorder, recurrent severe without psychotic features: Secondary | ICD-10-CM | POA: Diagnosis not present

## 2024-04-07 DIAGNOSIS — F332 Major depressive disorder, recurrent severe without psychotic features: Secondary | ICD-10-CM | POA: Diagnosis not present

## 2024-04-08 DIAGNOSIS — F332 Major depressive disorder, recurrent severe without psychotic features: Secondary | ICD-10-CM | POA: Diagnosis not present

## 2024-04-12 DIAGNOSIS — F332 Major depressive disorder, recurrent severe without psychotic features: Secondary | ICD-10-CM | POA: Diagnosis not present

## 2024-04-13 DIAGNOSIS — F332 Major depressive disorder, recurrent severe without psychotic features: Secondary | ICD-10-CM | POA: Diagnosis not present

## 2024-04-14 DIAGNOSIS — F332 Major depressive disorder, recurrent severe without psychotic features: Secondary | ICD-10-CM | POA: Diagnosis not present

## 2024-04-15 ENCOUNTER — Other Ambulatory Visit (HOSPITAL_COMMUNITY): Payer: Self-pay

## 2024-04-15 DIAGNOSIS — F332 Major depressive disorder, recurrent severe without psychotic features: Secondary | ICD-10-CM | POA: Diagnosis not present

## 2024-04-18 DIAGNOSIS — F332 Major depressive disorder, recurrent severe without psychotic features: Secondary | ICD-10-CM | POA: Diagnosis not present

## 2024-04-19 ENCOUNTER — Other Ambulatory Visit (HOSPITAL_COMMUNITY): Payer: Self-pay

## 2024-04-19 DIAGNOSIS — F332 Major depressive disorder, recurrent severe without psychotic features: Secondary | ICD-10-CM | POA: Diagnosis not present

## 2024-04-21 ENCOUNTER — Other Ambulatory Visit (HOSPITAL_COMMUNITY): Payer: Self-pay

## 2024-04-21 DIAGNOSIS — F332 Major depressive disorder, recurrent severe without psychotic features: Secondary | ICD-10-CM | POA: Diagnosis not present

## 2024-04-21 MED ORDER — HYDROXYZINE PAMOATE 25 MG PO CAPS
25.0000 mg | ORAL_CAPSULE | Freq: Three times a day (TID) | ORAL | 0 refills | Status: AC | PRN
  Filled 2024-04-21 – 2024-07-05 (×2): qty 270, 90d supply, fill #0

## 2024-04-21 MED ORDER — HYDROXYZINE PAMOATE 25 MG PO CAPS
ORAL_CAPSULE | ORAL | 0 refills | Status: DC
  Filled 2024-04-21: qty 90, 30d supply, fill #0

## 2024-04-22 ENCOUNTER — Other Ambulatory Visit (HOSPITAL_COMMUNITY): Payer: Self-pay

## 2024-04-22 DIAGNOSIS — F332 Major depressive disorder, recurrent severe without psychotic features: Secondary | ICD-10-CM | POA: Diagnosis not present

## 2024-04-25 ENCOUNTER — Other Ambulatory Visit (HOSPITAL_COMMUNITY): Payer: Self-pay

## 2024-04-25 ENCOUNTER — Other Ambulatory Visit: Payer: Self-pay

## 2024-04-26 DIAGNOSIS — F332 Major depressive disorder, recurrent severe without psychotic features: Secondary | ICD-10-CM | POA: Diagnosis not present

## 2024-04-27 DIAGNOSIS — F332 Major depressive disorder, recurrent severe without psychotic features: Secondary | ICD-10-CM | POA: Diagnosis not present

## 2024-04-28 DIAGNOSIS — F332 Major depressive disorder, recurrent severe without psychotic features: Secondary | ICD-10-CM | POA: Diagnosis not present

## 2024-04-29 DIAGNOSIS — F332 Major depressive disorder, recurrent severe without psychotic features: Secondary | ICD-10-CM | POA: Diagnosis not present

## 2024-05-02 DIAGNOSIS — F332 Major depressive disorder, recurrent severe without psychotic features: Secondary | ICD-10-CM | POA: Diagnosis not present

## 2024-05-03 DIAGNOSIS — F332 Major depressive disorder, recurrent severe without psychotic features: Secondary | ICD-10-CM | POA: Diagnosis not present

## 2024-05-31 ENCOUNTER — Other Ambulatory Visit (HOSPITAL_COMMUNITY): Payer: Self-pay

## 2024-06-10 DIAGNOSIS — F332 Major depressive disorder, recurrent severe without psychotic features: Secondary | ICD-10-CM | POA: Diagnosis not present

## 2024-06-14 DIAGNOSIS — F332 Major depressive disorder, recurrent severe without psychotic features: Secondary | ICD-10-CM | POA: Diagnosis not present

## 2024-06-23 ENCOUNTER — Other Ambulatory Visit (HOSPITAL_COMMUNITY): Payer: Self-pay

## 2024-06-23 DIAGNOSIS — F3181 Bipolar II disorder: Secondary | ICD-10-CM | POA: Diagnosis not present

## 2024-06-23 DIAGNOSIS — F902 Attention-deficit hyperactivity disorder, combined type: Secondary | ICD-10-CM | POA: Diagnosis not present

## 2024-06-23 DIAGNOSIS — F411 Generalized anxiety disorder: Secondary | ICD-10-CM | POA: Diagnosis not present

## 2024-06-23 MED ORDER — ATOMOXETINE HCL 40 MG PO CAPS
40.0000 mg | ORAL_CAPSULE | Freq: Every day | ORAL | 0 refills | Status: DC
  Filled 2024-06-23: qty 30, 30d supply, fill #0

## 2024-06-23 MED ORDER — LAMOTRIGINE 25 MG PO TABS
25.0000 mg | ORAL_TABLET | Freq: Every day | ORAL | 0 refills | Status: DC
  Filled 2024-06-23: qty 30, 22d supply, fill #0

## 2024-06-23 MED ORDER — HYDROXYZINE HCL 25 MG PO TABS
25.0000 mg | ORAL_TABLET | Freq: Two times a day (BID) | ORAL | 0 refills | Status: DC | PRN
  Filled 2024-06-23: qty 60, 30d supply, fill #0

## 2024-07-05 ENCOUNTER — Other Ambulatory Visit (HOSPITAL_COMMUNITY): Payer: Self-pay

## 2024-07-06 ENCOUNTER — Other Ambulatory Visit: Payer: Self-pay

## 2024-07-06 DIAGNOSIS — F320 Major depressive disorder, single episode, mild: Secondary | ICD-10-CM | POA: Diagnosis not present

## 2024-07-06 DIAGNOSIS — F32A Depression, unspecified: Secondary | ICD-10-CM | POA: Diagnosis not present

## 2024-07-06 DIAGNOSIS — R45851 Suicidal ideations: Secondary | ICD-10-CM | POA: Diagnosis not present

## 2024-07-06 NOTE — ED Notes (Signed)
 Patient Advocate observed patient lying in bed resting quietly.    [facility] 07/06/24 2254

## 2024-07-07 ENCOUNTER — Other Ambulatory Visit (HOSPITAL_COMMUNITY): Payer: Self-pay

## 2024-07-07 DIAGNOSIS — F3289 Other specified depressive episodes: Secondary | ICD-10-CM | POA: Diagnosis not present

## 2024-07-08 ENCOUNTER — Other Ambulatory Visit (HOSPITAL_COMMUNITY): Payer: Self-pay

## 2024-07-09 ENCOUNTER — Other Ambulatory Visit (HOSPITAL_COMMUNITY): Payer: Self-pay

## 2024-07-10 ENCOUNTER — Emergency Department (HOSPITAL_COMMUNITY)
Admission: EM | Admit: 2024-07-10 | Discharge: 2024-07-11 | Disposition: A | Discharge status: Home / Self Care | Attending: Emergency Medicine | Admitting: Emergency Medicine

## 2024-07-10 ENCOUNTER — Other Ambulatory Visit: Payer: Self-pay

## 2024-07-10 DIAGNOSIS — R9431 Abnormal electrocardiogram [ECG] [EKG]: Secondary | ICD-10-CM | POA: Diagnosis not present

## 2024-07-10 DIAGNOSIS — R45851 Suicidal ideations: Secondary | ICD-10-CM | POA: Diagnosis not present

## 2024-07-10 DIAGNOSIS — F845 Asperger's syndrome: Secondary | ICD-10-CM | POA: Insufficient documentation

## 2024-07-10 DIAGNOSIS — F4323 Adjustment disorder with mixed anxiety and depressed mood: Secondary | ICD-10-CM | POA: Diagnosis not present

## 2024-07-10 DIAGNOSIS — F339 Major depressive disorder, recurrent, unspecified: Secondary | ICD-10-CM | POA: Insufficient documentation

## 2024-07-10 DIAGNOSIS — F909 Attention-deficit hyperactivity disorder, unspecified type: Secondary | ICD-10-CM | POA: Diagnosis not present

## 2024-07-10 DIAGNOSIS — F319 Bipolar disorder, unspecified: Secondary | ICD-10-CM | POA: Insufficient documentation

## 2024-07-10 DIAGNOSIS — F332 Major depressive disorder, recurrent severe without psychotic features: Secondary | ICD-10-CM | POA: Diagnosis present

## 2024-07-10 LAB — CBC
HCT: 38.9 % (ref 36.0–46.0)
Hemoglobin: 12.6 g/dL (ref 12.0–15.0)
MCH: 29 pg (ref 26.0–34.0)
MCHC: 32.4 g/dL (ref 30.0–36.0)
MCV: 89.6 fL (ref 80.0–100.0)
Platelets: 359 K/uL (ref 150–400)
RBC: 4.34 MIL/uL (ref 3.87–5.11)
RDW: 12.7 % (ref 11.5–15.5)
WBC: 8.4 K/uL (ref 4.0–10.5)
nRBC: 0 % (ref 0.0–0.2)

## 2024-07-10 LAB — COMPREHENSIVE METABOLIC PANEL WITH GFR
ALT: 17 U/L (ref 0–44)
AST: 15 U/L (ref 15–41)
Albumin: 4.1 g/dL (ref 3.5–5.0)
Alkaline Phosphatase: 51 U/L (ref 38–126)
Anion gap: 10 (ref 5–15)
BUN: 11 mg/dL (ref 6–20)
CO2: 21 mmol/L — ABNORMAL LOW (ref 22–32)
Calcium: 9.6 mg/dL (ref 8.9–10.3)
Chloride: 107 mmol/L (ref 98–111)
Creatinine, Ser: 0.79 mg/dL (ref 0.44–1.00)
GFR, Estimated: >60 mL/min (ref >60)
Glucose, Bld: 101 mg/dL — ABNORMAL HIGH (ref 70–99)
Potassium: 3.8 mmol/L (ref 3.5–5.1)
Sodium: 138 mmol/L (ref 135–145)
Total Bilirubin: 0.9 mg/dL (ref 0.0–1.2)
Total Protein: 7.6 g/dL (ref 6.5–8.1)

## 2024-07-10 LAB — RAPID URINE DRUG SCREEN, HOSP PERFORMED
Amphetamines: NOT DETECTED
Barbiturates: NOT DETECTED
Benzodiazepines: NOT DETECTED
Cocaine: NOT DETECTED
Opiates: NOT DETECTED
Tetrahydrocannabinol: NOT DETECTED

## 2024-07-10 LAB — HCG, SERUM, QUALITATIVE: Preg, Serum: NEGATIVE

## 2024-07-10 LAB — ETHANOL: Alcohol, Ethyl (B): <15 mg/dL (ref <15)

## 2024-07-10 MED ORDER — LAMOTRIGINE 25 MG PO TABS
50.0000 mg | ORAL_TABLET | Freq: Every evening | ORAL | Status: DC
  Administered 2024-07-10: 50 mg via ORAL

## 2024-07-10 MED ORDER — LAMOTRIGINE 25 MG PO TABS: 50.0000 mg | ORAL_TABLET | Freq: Every evening | ORAL | Status: DC

## 2024-07-10 MED ORDER — HYDROXYZINE PAMOATE 25 MG PO CAPS: 25.0000 mg | ORAL_CAPSULE | Freq: Three times a day (TID) | ORAL | Status: DC | PRN

## 2024-07-10 MED ORDER — MIRTAZAPINE 30 MG PO TABS: 30.0000 mg | ORAL_TABLET | Freq: Every day | ORAL | Status: DC

## 2024-07-10 MED ORDER — HYDROXYZINE HCL 25 MG PO TABS
25.0000 mg | ORAL_TABLET | Freq: Three times a day (TID) | ORAL | Status: DC | PRN
  Administered 2024-07-11: 25 mg via ORAL
  Filled 2024-07-10: qty 1
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
## 2024-07-10 NOTE — ED Provider Notes (Signed)
 [place] EMERGENCY DEPARTMENT AT [facility] Provider Note   CSN: [id number] Arrival date & time: 07/10/24  1644     Patient presents with: Suicidal   Betty Davis is a 18 y.o. female with PMHx ADHD, MDD who presents to ED concerned for SI. Denies HI or AVH. Patient with plan to cut herself. Patient endorsing depression for years which has recently worsened d/t ending a relationship. Patient denies any infectious symptoms or other complaints today.   HPI     Prior to Admission medications   Medication Sig Start Date End Date Taking? Authorizing Provider  atomoxetine  (STRATTERA ) 40 MG capsule Take 1 capsule (40 mg total) by mouth in the morning. 03/11/24     atomoxetine  (STRATTERA ) 40 MG capsule Take 1 capsule (40 mg total) by mouth daily. 06/23/24     ciprofloxacin -dexamethasone  (CIPRODEX ) OTIC suspension Instill 4 drops into affected ear 2 (two) times daily for 7 days. 03/28/24     desvenlafaxine  (PRISTIQ ) 50 MG 24 hr tablet Take 1 tablet by mouth every night at bedtime 02/05/24     fluticasone  (FLONASE ) 50 MCG/ACT nasal spray Place 1 spray into both nostrils 2 (two) times daily. 11/09/23     hydrocortisone  cream 1 % Apply topically 2 (two) times daily. 01/22/24   Jonnalagadda, Janardhana, MD  hydrOXYzine  (ATARAX ) 10 MG tablet Take 1 tablet (10 mg total) by mouth 2 (two) times daily as needed for anxiety. 01/22/24   Jonnalagadda, Janardhana, MD  hydrOXYzine  (ATARAX ) 25 MG tablet Take 1 tablet (25 mg total) by mouth 2 (two) times daily as needed for anxiety or sleep 06/23/24     hydrOXYzine  (VISTARIL ) 25 MG capsule Take 1 capsule (25 mg total) by mouth 3 (three) times daily as needed for anxiety. 04/21/24     hydrOXYzine  (VISTARIL ) 25 MG capsule Take 1 capsule (25 mg total) by mouth 3 (three) times daily as needed for anxiety 04/21/24     ibuprofen  (ADVIL ) 200 MG tablet Take 200 mg by mouth every 6 (six) hours as needed for headache.    [provider]  lamoTRIgine  (LAMICTAL )  25 MG tablet Take 1 tablet (25 mg total) by mouth daily for two weeks then increase to two tablets daily 06/23/24     norethindrone -ethinyl estradiol -FE (LOESTRIN  FE 1/20) 1-20 MG-MCG tablet Take 1 tablet by mouth daily. 01/18/24     mirtazapine  (REMERON ) 30 MG tablet Take 1 tablet (30 mg total) by mouth at bedtime. 01/22/24   Jonnalagadda, Janardhana, MD  naphazoline-glycerin  (CLEAR EYES REDNESS) 0.012-0.25 % SOLN Place 1-2 drops into both eyes 4 (four) times daily as needed for eye irritation. 01/22/24   Jonnalagadda, Janardhana, MD  norethindrone -ethinyl estradiol -FE (LOESTRIN  FE) 1-20 MG-MCG tablet Take 1 tablet by mouth daily. 02/05/24       Allergies: Orange peel extract monetta oil]    Review of Systems  Psychiatric/Behavioral:  Positive for suicidal ideas.     Updated Vital Signs BP 124/79 (BP Location: Left Arm)   Pulse 89   Temp 97.9 F (36.6 C) (Oral)   Resp 18   SpO2 100%   Physical Exam Vitals and nursing note reviewed.  Constitutional:      General: She is not in acute distress.    Appearance: She is not ill-appearing or toxic-appearing.  HENT:     Head: Normocephalic and atraumatic.     Mouth/Throat:     Mouth: Mucous membranes are moist.  Eyes:     General: No scleral icterus.  Right eye: No discharge.        Left eye: No discharge.     Conjunctiva/sclera: Conjunctivae normal.  Cardiovascular:     Rate and Rhythm: Normal rate and regular rhythm.     Pulses: Normal pulses.     Heart sounds: Normal heart sounds. No murmur heard. Pulmonary:     Effort: Pulmonary effort is normal. No respiratory distress.     Breath sounds: Normal breath sounds. No wheezing, rhonchi or rales.  Abdominal:     General: Abdomen is flat. Bowel sounds are normal.     Palpations: Abdomen is soft.  Musculoskeletal:     Right lower leg: No edema.     Left lower leg: No edema.     Comments: Ambulatory with steady gait.  Skin:    General: Skin is warm and dry.     Findings: No  rash.  Neurological:     General: No focal deficit present.     Mental Status: She is alert and oriented to person, place, and time. Mental status is at baseline.  Psychiatric:        Mood and Affect: Mood normal.     Comments: tearful     (all labs ordered are listed, but only abnormal results are displayed) Labs Reviewed  COMPREHENSIVE METABOLIC PANEL WITH GFR - Abnormal; Notable for the following components:      Result Value   CO2 21 (*)    Glucose, Bld 101 (*)    All other components within normal limits  ETHANOL  CBC  RAPID URINE DRUG SCREEN, HOSP PERFORMED  HCG, SERUM, QUALITATIVE    EKG: None  Radiology: No results found.   Procedures   Medications Ordered in the ED - No data to display                                  Medical Decision Making Amount and/or Complexity of Data Reviewed Labs: ordered.   This patient presents to the ED for psych evaluation, this involves an extensive number of treatment options, and is a complaint that carries with it a high risk of complications and morbidity.  The differential diagnosis includes primary psychosis, substance-induced psychosis, mood disturbance, SI/HI.   Co morbidities that complicate the patient evaluation  ADHD, MDD   Additional history obtained:  Dr. Robynn PCP   Lab Tests:  I Ordered, and personally interpreted labs.  The pertinent results include:   - UDS: Negative - EtOH: Negative - hCG: Negative - CBC: Without leukocytosis or anemia - CMP: CO2 mildly low at 21   Problem List / ED Course / Critical interventions / Medication management  Patient presented for psychiatric evaluation. Patient is endorsing SI. On my initial exam, the pt was linear in thought, appropriate in affect, and overall well-appearing. Vital signs reviewed and reassuring. With the patient's presentation of SI, patient warrants emergent psychiatric consultation.  Patient immediately placed into ED psychiatric hold protocol  including suicide precautions, elopement precautions and vital sign monitoring. TTS consulted for further evaluation once patient medically cleared. Medical screening evaluation ordered and reviewed with no obvious medical reason to postpone psychiatric evaluation. Patient is voluntary at this time. No IVC. May need to be reassessed if capacity is changing.  I have reviewed the patients home medicines and have made adjustments as needed Patient medically cleared. dispo pending Psychiatric consultation.  Social Determinants of Health:  none  Final diagnoses:  Suicidal ideation    ED Discharge Orders     None          Hoy Nidia FALCON, [place] 07/10/24 1759    Francesca Elsie CROME, MD 07/10/24 2032

## 2024-07-10 NOTE — ED Notes (Signed)
Patient is unable to give a urine sample at this time.  

## 2024-07-10 NOTE — ED Notes (Signed)
 Patients father arrived to front desk stating patient called and asked him to come. But Patient requested that none of her family members come back with her. Per Emergency planning/management officer that dropped her off. I told him she did not want any visitors at this time and he said he will just go wait in the car.

## 2024-07-10 NOTE — ED Notes (Addendum)
 PT placed in room 29.  Mother is sitting with her.  Meal given, belongings placed in locker 29

## 2024-07-11 ENCOUNTER — Other Ambulatory Visit (HOSPITAL_COMMUNITY): Payer: Self-pay

## 2024-07-11 DIAGNOSIS — R9431 Abnormal electrocardiogram [ECG] [EKG]: Secondary | ICD-10-CM | POA: Diagnosis not present

## 2024-07-11 DIAGNOSIS — R45851 Suicidal ideations: Secondary | ICD-10-CM | POA: Diagnosis not present

## 2024-07-11 DIAGNOSIS — F4323 Adjustment disorder with mixed anxiety and depressed mood: Secondary | ICD-10-CM

## 2024-07-11 MED ORDER — NORETHIN ACE-ETH ESTRAD-FE 1-20 MG-MCG PO TABS
1.0000 | ORAL_TABLET | Freq: Every day | ORAL | Status: DC
  Administered 2024-07-11: 1 via ORAL

## 2024-07-11 NOTE — ED Notes (Signed)
 Writer came in this morning to find EKG not done from the night before. EKG obtained as soon as Clinical research associate noticed. EKG given to Dr. Levander for review.

## 2024-07-11 NOTE — Discharge Instructions (Addendum)
 Schedule follow-up with outpatient psychiatric provider and explore options for individual and family-based therapy  Please Follow up with referrals given to [facility], also follow up with [facility], [facility], and [facility] for possible residential placement.    Discharge recommendations:  Patient is to take medications as prescribed. Please see information for follow-up appointment with psychiatry and therapy. Please follow up with your primary care provider for all medical related needs.   Therapy: We recommend that patient participate in individual therapy to address mental health concerns.  Medications: The patient or guardian is to contact a medical professional and/or outpatient provider to address any new side effects that develop. The patient or guardian should update outpatient providers of any new medications and/or medication changes.   Atypical antipsychotics: If you are prescribed an atypical antipsychotic, it is recommended that your height, weight, BMI, blood pressure, fasting lipid panel, and fasting blood sugar be monitored by your outpatient providers.  Safety:  The patient should abstain from use of illicit substances/drugs and abuse of any medications. If symptoms worsen or do not continue to improve or if the patient becomes actively suicidal or homicidal then it is recommended that the patient return to the closest hospital emergency department, the [facility], or call 911 for further evaluation and treatment. National Suicide Prevention Lifeline 1-800-SUICIDE or 706-403-4519.  About 988 988 offers 24/7 access to trained crisis counselors who can help people experiencing mental health-related distress. People can call or text 988 or chat 988lifeline.org for themselves or if they are worried about a loved one who may need crisis support.  Crisis Mobile: [facility]:                      873-456-9756 (for crisis response 24 hours a day) [facility] Hotline:                                            360-421-5933   Safety Plan Tamantha Saline will reach out to her mother Azia Toutant, call 911 or call mobile crisis, or go to nearest emergency room if condition worsens or if suicidal thoughts become active Patients' will follow up with Dr. Jess ([facility]) for outpatient psychiatric services (therapy/medication management).  The suicide prevention education provided includes the following: Suicide risk factors Suicide prevention and interventions National Suicide Hotline telephone number [facility] assessment telephone number [facility] Emergency Assistance 911 [facility] and/or Residential Mobile Crisis Unit telephone number Request made of family/significant other to:  mother Dotsie Gillette Remove weapons (e.g., guns, rifles, knives), all items previously/currently identified as safety concern.   Remove drugs/medications (over the counter, prescriptions, illicit drugs), all items previously/currently identified as a safety concern.    Clinical Rationale for Family Counseling:  Family dynamics play a critical role in adolescent and young adult psychiatric recovery. Incorporating family into care can:  Improve treatment adherence and emotional support  Reduce environmental triggers that may contribute to symptom exacerbation  Jerrye trust and transparency between the patient and caregivers  Aid in early detection of relapse, suicidality, or behavioral changes  Help the patient feel seen and supported, enhancing safety and stabilization

## 2024-07-11 NOTE — Consult Note (Signed)
 [facility] Psychiatric Consult Follow-up  Patient Name: .Betty Davis  MRN: [id number]  DOB: 04-08-2006  Consult Order details:  Orders (From admission, onward)     Start     Ordered   07/10/24 1733  CONSULT TO CALL ACT TEAM       Ordering Provider: Hoy Nidia FALCON, PA-C  Provider:  (Not yet assigned)  Question:  Reason for Consult?  Answer:  SI   07/10/24 1732             Mode of Visit: In person    Psychiatry Consult Evaluation  Service Date: July 11, 2024 LOS:  LOS: 0 days  Chief Complaint worsening of depression  Primary Psychiatric Diagnoses   Major Depressive Disorder, recurrent,   ADHD Asperger's Disorder  Assessment  Betty Davis is a 18 y.o. female admitted: Presented to the ED on 07/10/2024  4:45 PM for plans to cut self. She carries the psychiatric diagnoses of depression, bipolar and anxiety and has a past medical history of  none.    18 year old with chronic and complex mental health conditions presents with passive suicidal ideation and a history of emotional dysregulation. At this time, patient denies active SI/HI/AVH, is contracting for safety, and has supportive family involvement. She is psychiatrically stable for discharge from the ED with appropriate outpatient follow-up and safety planning. No criteria met for involuntary psychiatric admission. Please see plan below for detailed recommendations.    Diagnoses:  Active Hospital problems: Principal Problem:   Adjustment disorder with mixed anxiety and depressed mood Active Problems:   MDD (major depressive disorder), recurrent severe, without psychosis (HCC)   Bipolar disorder (HCC)    Plan   ## Psychiatric Medication Recommendations:  Continue patient home medications, no prescriptions written as patient will be following up with her psychiatric provider Dr. Jess at [facility]  Plan: Safety plan reviewed with patient and mother - both agreed and understand  plan  Provided resources and referrals for the following:   Residential treatment facilities appropriate for mood and neurodevelopmental disorders  Dialectical Behavior Therapy (DBT) program have an appointment on 07/14/24  Outpatient psychiatric medication management follow-up this week  Family counseling referral for enhanced support and communication  Advised to return to ED or call 911 for any worsening of symptoms or emergent safety concerns  ## Medical Decision Making Capacity: Not specifically addressed in this encounter  ## Further Work-up:  -- No further work up needed at this time  EKG or UDS -- Updated EKG ordered -- Pertinent labwork reviewed earlier this admission includes: CMP, CBC, EKG, UDS   ## Disposition:-- Patient is psychiatrically stable for discharge to home under the supervision of her mother. No inpatient criteria met at time of evaluation.   ## Behavioral / Environmental: -To minimize splitting of staff, assign one staff person to communicate all information from the team when feasible. or Utilize compassion and acknowledge the patient's experiences while setting clear and realistic expectations for care.    ## Safety and Observation Level:  - Based on my clinical evaluation, I estimate the patient to be at no  risk of self harm in the current setting. - At this time, we recommend  routine. This decision is based on my review of the chart including patient's history and current presentation, interview of the patient, mental status examination, and consideration of suicide risk including evaluating suicidal ideation, plan, intent, suicidal or self-harm behaviors, risk factors, and protective factors. This judgment is based on our  ability to directly address suicide risk, implement suicide prevention strategies, and develop a safety plan while the patient is in the clinical setting. Please contact our team if there is a concern that risk level has changed.  CSSR  Risk Category:C-SSRS RISK CATEGORY: High Risk  Suicide Risk Assessment: Patient has following modifiable risk factors for suicide: social isolation and current symptoms: anxiety/panic, insomnia, impulsivity, anhedonia, hopelessness, which we are addressing psychiatrically stable for discharge from the ED with appropriate outpatient follow-up and safety planning.. Patient has following non-modifiable or demographic risk factors for suicide: psychiatric hospitalization Patient has the following protective factors against suicide: Access to outpatient mental health care and Supportive family  Thank you for this consult request. Recommendations have been communicated to the primary team.  We will continue to follow patient at this time.   Betty Davis, PMHNP       History of Present Illness  Relevant Aspects of Hospital ED Course:  Admitted on 07/10/2024 for worsening of depression   Patient Report:  Betty Davis, 18 y.o., female patient seen face to face by this provider, consulted with Dr. Larina; and chart reviewed on 07/11/24.  On evaluation Betty Davis patients mother is at bedside and is agreeable with the psychiatric team speaking with patient alone. Patient is sitting up in bed, with no complaints. She is calm and cooperative during this assessment. Her appearance is appropriate for environment. Her eye contact is good. Speech is clear and coherent, normal pace and normal volume. She is alert and oriented x3 to person, place, and situation. She reports her mood is good. Affect is congruent with mood.  Thought process is coherent and disorganized. Thought content is slightly tangential.  She denies auditory and visual hallucinations. No indication that she is responding to internal stimuli during this assessment.  No delusions elicited during this assessment.  She denies suicidal ideations and homicidal ideations. Appetite and sleep she reports as good.  Patient is able to contract for  safety and her support system is in place, her mother and father.  She states that she is willing to give DBT (dialectical behavioral therapy) a honest try.  She states that she would like resources on residential treatment facilities, feels that she wants to go away from home, to seek help. Mother and she state they are triggers at home, where she thinks her parents are overbearing, and her grandfather died in the home. She says she wants to get away for awhile and venture on her own. Her mother is in agreement, says she will allow her to experience things and love her form afar, until she is ready to return home.  During evaluation, she denied current suicidal or homicidal ideation, auditory/visual hallucinations, and was able to contract for safety. Mental status was notable for restricted affect, cooperative behavior, and slowed thought processes. Her mother was present and supportive during the evaluation. No acute safety concerns were identified at the time of discharge.  Recommendations & Follow-Up Plan:  Outpatient Psychiatry Follow-Up:  Continue current medications (Lamictal , Strattera , Atarax )  Reassess for possible medication augmentation or mood stabilizer support  Psychotherapy:  Begin or resume Dialectical Behavior Therapy (DBT) for emotion regulation and self-harm prevention  Referral to individual psychotherapy specializing in treatment-resistant depression and ASD  Family Counseling:  Schedule family therapy to improve communication, support dynamics, and collaborative safety planning  Residential Treatment Referral:  Referral list provided for programs equipped to manage co-occurring mood, neurodevelopmental, and personality disorders  Safety Plan:  Patient and mother  agree on home safety measures (no access to sharps or medications unsupervised)  Crisis hotline and emergency procedures reviewed  Psych ROS:  Depression: Endorses, situational due to recent  break-up Anxiety: Endorses Mania (lifetime and current): Lifetime Psychosis: (lifetime and current): Denies   Collateral information:  Mother at bedside    ROS   Psychiatric and Social History  Psychiatric History:  Information collected from patient's mother and patient  Prev Dx/Sx: Bipolar disorder, Asperger's, personality disorder Current Psych Provider: Yes Home Meds (current): Yes Previous Med Trials: Yes Therapy: Yes  Prior Psych Hospitalization: Yes Prior Self Harm: Yes Prior Violence: Denies  Family Psych History: Denies Family Hx suicide: Denies  Social History:  Developmental Hx: Asperger's Educational Hx: Patient graduated high school Occupational Hx: Unemployed Armed forces operational officer Hx: Denies Living Situation: Lives at home with parents Spiritual Hx: Yes Access to weapons/lethal means: Denies  Substance History Patient and parents states there is no past or current substance abuse history, patient UDS and BAL are negative.  Exam Findings  Physical Exam:  Vital Signs:  Temp:  [97.9 F (36.6 C)-99.2 F (37.3 C)] 98 F (36.7 C) (07/21 0623) Pulse Rate:  [76-89] 76 (07/21 0623) Resp:  [18] 18 (07/21 0623) BP: (99-124)/(62-99) 99/62 (07/21 0623) SpO2:  [97 %-100 %] 100 % (07/21 0623) Blood pressure 99/62, pulse 76, temperature 98 F (36.7 C), temperature source Oral, resp. rate 18, SpO2 100%. There is no height or weight on file to calculate BMI.  Physical Exam  Mental Status Exam: General Appearance: Casual  Orientation:  Full (Time, Place, and Person)  Memory:  Immediate;   Fair Remote;   Fair  Concentration:  Concentration: Fair and Attention Span: Fair  Recall:  Good  Attention  Good   Eye Contact:  Fair  Speech:  Clear and Coherent  Language:  Fair  Volume:  Normal  Mood: feeling better  Affect:  Congruent  Thought Process:  Coherent  Thought Content:  Logical  Suicidal Thoughts:  No  Homicidal Thoughts:  No  Judgement:  Fair  Insight:  Fair   Psychomotor Activity:  Normal  Akathisia:  NA  Fund of Knowledge:  Fair      Assets:  Manufacturing systems engineer Desire for Improvement Financial Resources/Insurance Housing Social Support  Cognition:  WNL  ADL's:  Intact  AIMS (if indicated):        Other History   These have been pulled in through the EMR, reviewed, and updated if appropriate.  Family History:  The patient's family history includes Cancer in her maternal grandmother; Drug abuse in her paternal aunt and paternal uncle; Healthy in her mother; Suicidality in her paternal aunt and paternal uncle.  Medical History: Past Medical History:  Diagnosis Date   ADHD (attention deficit hyperactivity disorder)     Surgical History: No past surgical history on file.   Medications:   Current Facility-Administered Medications:    hydrOXYzine  (ATARAX ) tablet 25 mg, 25 mg, Oral, TID PRN, Countryman, Chase, MD, 25 mg at 07/11/24 1053   lamoTRIgine  (LAMICTAL ) tablet 50 mg, 50 mg, Oral, QPM, Countryman, Chase, MD, 50 mg at 07/10/24 2317   mirtazapine  (REMERON ) tablet 30 mg, 30 mg, Oral, QHS, Countryman, Chase, MD   norethindrone -ethinyl estradiol -FE (LOESTRIN  FE) 1-20 MG-MCG per tablet 1 tablet, 1 tablet, Oral, Daily, Levander Houston, MD, 1 tablet at 07/11/24 0900  Current Outpatient Medications:    acetaminophen  (TYLENOL ) 500 MG tablet, Take 500 mg by mouth every 6 (six) hours as needed for moderate pain (pain score  4-6)., Disp: , Rfl:    atomoxetine  (STRATTERA ) 40 MG capsule, Take 1 capsule (40 mg total) by mouth in the morning., Disp: 90 capsule, Rfl: 3   hydrOXYzine  (VISTARIL ) 25 MG capsule, Take 1 capsule (25 mg total) by mouth 3 (three) times daily as needed for anxiety. (Patient taking differently: Take 25 mg by mouth See admin instructions. Take 25 mg by mouth in the morning. May 25 mg twice daily as needed for anxiety.), Disp: 90 capsule, Rfl: 0   ibuprofen  (ADVIL ) 200 MG tablet, Take 200 mg by mouth daily as needed for  headache, mild pain (pain score 1-3) or moderate pain (pain score 4-6)., Disp: , Rfl:    lamoTRIgine  (LAMICTAL ) 25 MG tablet, Take 1 tablet (25 mg total) by mouth daily for two weeks then increase to two tablets daily (Patient taking differently: Take 50 mg by mouth every evening.), Disp: 30 tablet, Rfl: 0   naphazoline-glycerin  (CLEAR EYES REDNESS) 0.012-0.25 % SOLN, Place 1-2 drops into both eyes 4 (four) times daily as needed for eye irritation., Disp: , Rfl:    norethindrone -ethinyl estradiol -FE (LOESTRIN  FE 1/20) 1-20 MG-MCG tablet, Take 1 tablet by mouth daily., Disp: 84 tablet, Rfl: 1   atomoxetine  (STRATTERA ) 40 MG capsule, Take 1 capsule (40 mg total) by mouth daily., Disp: 30 capsule, Rfl: 0   ciprofloxacin -dexamethasone  (CIPRODEX ) OTIC suspension, Instill 4 drops into affected ear 2 (two) times daily for 7 days. (Patient not taking: Reported on 07/10/2024), Disp: 7.5 mL, Rfl: 0   desvenlafaxine  (PRISTIQ ) 50 MG 24 hr tablet, Take 1 tablet by mouth every night at bedtime (Patient not taking: Reported on 07/10/2024), Disp: 30 tablet, Rfl: 0   fluticasone  (FLONASE ) 50 MCG/ACT nasal spray, Place 1 spray into both nostrils 2 (two) times daily. (Patient not taking: Reported on 07/10/2024), Disp: 48 g, Rfl: 1   hydrocortisone  cream 1 %, Apply topically 2 (two) times daily. (Patient not taking: Reported on 07/10/2024), Disp: , Rfl:    hydrOXYzine  (ATARAX ) 10 MG tablet, Take 1 tablet (10 mg total) by mouth 2 (two) times daily as needed for anxiety. (Patient not taking: Reported on 07/10/2024), Disp: 30 tablet, Rfl: 0   hydrOXYzine  (VISTARIL ) 25 MG capsule, Take 1 capsule (25 mg total) by mouth 3 (three) times daily as needed for anxiety, Disp: 270 capsule, Rfl: 0   mirtazapine  (REMERON ) 30 MG tablet, Take 1 tablet (30 mg total) by mouth at bedtime. (Patient not taking: Reported on 07/10/2024), Disp: 30 tablet, Rfl: 0   norethindrone -ethinyl estradiol -FE (LOESTRIN  FE) 1-20 MG-MCG tablet, Take 1 tablet by mouth  daily., Disp: 84 tablet, Rfl: 3  Allergies: Allergies  Allergen Reactions   Orange Peel Extract [Orange Oil] Rash    Reaction to orange peel    Betty Davis, PMHNP

## 2024-07-11 NOTE — ED Notes (Signed)
 Mother states PT takes 40 mg of Strattera  every morning. Additionally, they refused the Remeron  saying that she hasn't taken it in a while because it didn't help her issues. EDP notified

## 2024-07-11 NOTE — ED Provider Notes (Addendum)
 Emergency Medicine Observation Re-evaluation Note  Betty Davis is a 18 y.o. female, seen on rounds today.  Pt initially presented to the ED for complaints of Suicidal Currently, the patient is being assessed for depression and suicidality.  Physical Exam  BP 99/62 (BP Location: Left Arm)   Pulse 76   Temp 98 F (36.7 C) (Oral)   Resp 18   SpO2 100%  Physical Exam General: Well-developed well-nourished no acute distress Cardiac: Heart rate normal blood pressure within normal limits Lungs: Normal respiratory rate no distress oxygen sats 100% Psych: Patient resting at this time  ED Course / MDM  EKG:EKG Interpretation Date/Time:  Monday July 11 2024 07:34:34 EDT Ventricular Rate:  74 PR Interval:  146 QRS Duration:  88 QT Interval:  388 QTC Calculation: 430 R Axis:   92  Text Interpretation: Normal sinus rhythm Rightward axis Borderline ECG No previous ECGs available Confirmed by Levander Houston [phone]) on 07/11/2024 7:41:41 AM  I have reviewed the labs performed to date as well as medications administered while in observation.  Recent changes in the last 24 hours include patient seen evaluated in the ED medically cleared psych saw and advised they would reassess in the morning.  Plan  Current plan is for psychiatric reevaluation today.. 1:10 PM Psych has seen patient and do not see indication for admission.  They have given patient information for follow-up.   Levander Houston, MD 07/11/24 9253    Levander Houston, MD 07/11/24 1310

## 2024-07-12 ENCOUNTER — Other Ambulatory Visit (HOSPITAL_COMMUNITY): Payer: Self-pay

## 2024-07-12 DIAGNOSIS — F605 Obsessive-compulsive personality disorder: Secondary | ICD-10-CM | POA: Diagnosis not present

## 2024-07-12 DIAGNOSIS — F411 Generalized anxiety disorder: Secondary | ICD-10-CM | POA: Diagnosis not present

## 2024-07-12 DIAGNOSIS — F3181 Bipolar II disorder: Secondary | ICD-10-CM | POA: Diagnosis not present

## 2024-07-12 DIAGNOSIS — F902 Attention-deficit hyperactivity disorder, combined type: Secondary | ICD-10-CM | POA: Diagnosis not present

## 2024-07-12 MED ORDER — LAMOTRIGINE 25 MG PO TABS
50.0000 mg | ORAL_TABLET | Freq: Every day | ORAL | 0 refills | Status: DC
  Filled 2024-07-12: qty 60, 30d supply, fill #0

## 2024-07-27 DIAGNOSIS — F3289 Other specified depressive episodes: Secondary | ICD-10-CM | POA: Diagnosis not present

## 2024-08-05 ENCOUNTER — Other Ambulatory Visit (HOSPITAL_COMMUNITY): Payer: Self-pay

## 2024-08-05 DIAGNOSIS — N920 Excessive and frequent menstruation with regular cycle: Secondary | ICD-10-CM | POA: Diagnosis not present

## 2024-08-05 DIAGNOSIS — F419 Anxiety disorder, unspecified: Secondary | ICD-10-CM | POA: Diagnosis not present

## 2024-08-05 MED ORDER — NORETHIN ACE-ETH ESTRAD-FE 1.5-30 MG-MCG PO TABS
1.0000 | ORAL_TABLET | Freq: Every day | ORAL | 1 refills | Status: DC
  Filled 2024-08-05: qty 28, 28d supply, fill #0
  Filled 2024-09-03: qty 28, 28d supply, fill #1

## 2024-08-09 DIAGNOSIS — F332 Major depressive disorder, recurrent severe without psychotic features: Secondary | ICD-10-CM | POA: Diagnosis not present

## 2024-08-11 ENCOUNTER — Other Ambulatory Visit (HOSPITAL_COMMUNITY): Payer: Self-pay

## 2024-09-05 ENCOUNTER — Ambulatory Visit (HOSPITAL_COMMUNITY)
Admission: EM | Admit: 2024-09-05 | Discharge: 2024-09-06 | Disposition: A | Discharge status: Home / Self Care | Attending: Psychiatry | Admitting: Psychiatry

## 2024-09-05 DIAGNOSIS — F411 Generalized anxiety disorder: Secondary | ICD-10-CM | POA: Diagnosis not present

## 2024-09-05 DIAGNOSIS — F603 Borderline personality disorder: Secondary | ICD-10-CM | POA: Insufficient documentation

## 2024-09-05 DIAGNOSIS — Z79899 Other long term (current) drug therapy: Secondary | ICD-10-CM | POA: Diagnosis not present

## 2024-09-05 DIAGNOSIS — F69 Unspecified disorder of adult personality and behavior: Secondary | ICD-10-CM

## 2024-09-05 DIAGNOSIS — R45851 Suicidal ideations: Secondary | ICD-10-CM | POA: Insufficient documentation

## 2024-09-05 DIAGNOSIS — F339 Major depressive disorder, recurrent, unspecified: Secondary | ICD-10-CM | POA: Diagnosis not present

## 2024-09-05 DIAGNOSIS — Z793 Long term (current) use of hormonal contraceptives: Secondary | ICD-10-CM | POA: Insufficient documentation

## 2024-09-05 DIAGNOSIS — F3181 Bipolar II disorder: Secondary | ICD-10-CM | POA: Diagnosis present

## 2024-09-05 MED ORDER — HALOPERIDOL 5 MG PO TABS: 5.0000 mg | ORAL_TABLET | Freq: Three times a day (TID) | ORAL | Status: DC | PRN

## 2024-09-05 MED ORDER — ALUM & MAG HYDROXIDE-SIMETH 200-200-20 MG/5ML PO SUSP: 30.0000 mL | ORAL | Status: DC | PRN

## 2024-09-05 MED ORDER — OLANZAPINE 10 MG IM SOLR: 10.0000 mg | Freq: Three times a day (TID) | INTRAMUSCULAR | Status: DC | PRN

## 2024-09-05 MED ORDER — DIPHENHYDRAMINE HCL 50 MG PO CAPS: 50.0000 mg | ORAL_CAPSULE | Freq: Three times a day (TID) | ORAL | Status: DC | PRN

## 2024-09-05 MED ORDER — OLANZAPINE 10 MG IM SOLR: 5.0000 mg | Freq: Three times a day (TID) | INTRAMUSCULAR | Status: DC | PRN

## 2024-09-05 MED ORDER — MAGNESIUM HYDROXIDE 400 MG/5ML PO SUSP: 30.0000 mL | Freq: Every day | ORAL | Status: DC | PRN

## 2024-09-05 MED ORDER — ACETAMINOPHEN 325 MG PO TABS: 650.0000 mg | ORAL_TABLET | Freq: Four times a day (QID) | ORAL | Status: DC | PRN

## 2024-09-05 NOTE — ED Provider Notes (Signed)
 [facility] Continuous Assessment Admission H&P  Date: 09/06/24 Patient Name: Betty Davis MRN: [id number] Chief Complaint: mood changes   Diagnoses:  Final diagnoses:  Recurrent major depressive disorder, remission status unspecified (HCC)  Passive suicidal ideations  Behavior concern in adult    HPI: Betty Davis, 18 y/o female with a history or MDD, GAD and most recently bipolar dx ,  presented [facility], companied by her mother.  Per the mother patient has not been acting herself, she is not completing day-to-day activity as usual and does need long-term inpatient.  When asked what does she mean by is not acting herself mother stated that patient been having mood disorders, she went on to say that patient was admitted in January 2025 at [facility] and right after that she went to a residential PHP program in [place]  in February.  According to mom patient behavior has not improved.  Patient currently sees a med provider and is prescribed hydroxyzine  and Strattera  40 mg.  Apart from that patient is not taking any other medication.  Patient currently work full-time.  Currently lives with mom and dad.   Face-to-face evaluation of patient, patient is alert and oriented x 4, speech is clear, maintain eye contact.  Patient answered questions appropriately does appear to be a little bit anxious.  When asked if she is suicidal patient stated yes but she has no plans.  Patient denies HI, AVH or paranoia.  Denies illicit drug use.  Denies smoking.  Denies drinking.  Denies access to guns.  Patient does seem to be influenced by mother talking.  And at times would look at her mom as if she is about to cry.  When asked questions patient would look at her mom first and wanted mom to answer.  Writer discussed with patient and mom that given patient prior hospitalization and current presentation we will be admitting patient for observation and then reassessment in the morning.  Total Time spent with  patient: 30 minutes  Musculoskeletal  Strength & Muscle Tone: within normal limits Gait & Station: normal Patient leans: N/A  Psychiatric Specialty Exam  Presentation General Appearance:  Casual  Eye Contact: Good  Speech: Clear and Coherent  Speech Volume: Normal  Handedness: Right   Mood and Affect  Mood: Anxious; Depressed  Affect: Congruent   Thought Process  Thought Processes: Coherent  Descriptions of Associations:Intact  Orientation:Full (Time, Place and Person)  Thought Content:Logical  Diagnosis of Schizophrenia or Schizoaffective disorder in past: No   Hallucinations:Hallucinations: None  Ideas of Reference:None  Suicidal Thoughts:Suicidal Thoughts: Yes, Passive SI Passive Intent and/or Plan: Without Plan; Without Intent  Homicidal Thoughts:Homicidal Thoughts: No   Sensorium  Memory: Immediate Fair  Judgment: Fair  Insight: Fair   Chartered certified accountant: Fair  Attention Span: Fair  Recall: [employer] of Knowledge: Fair  Language: Fair   Psychomotor Activity  Psychomotor Activity: Psychomotor Activity: Normal   Assets  Assets: Desire for Improvement; Resilience; Vocational/Educational   Sleep  Sleep: Sleep: Fair Number of Hours of Sleep: 8   Nutritional Assessment (For OBS and FBC admissions only) Has the patient had a weight loss or gain of 10 pounds or more in the last 3 months?: No Has the patient had a decrease in food intake/or appetite?: No Does the patient have dental problems?: No Does the patient have eating habits or behaviors that may be indicators of an eating disorder including binging or inducing vomiting?: No Has the patient recently lost weight  without trying?: 0 Has the patient been eating poorly because of a decreased appetite?: 0 Malnutrition Screening Tool Score: 0    Physical Exam HENT:     Head: Normocephalic.     Nose: Nose normal.  Eyes:     Pupils: Pupils are  equal, round, and reactive to light.  Cardiovascular:     Rate and Rhythm: Normal rate.  Pulmonary:     Effort: Pulmonary effort is normal.  Musculoskeletal:        General: Normal range of motion.     Cervical back: Normal range of motion.  Neurological:     General: No focal deficit present.     Mental Status: She is alert.  Psychiatric:        Mood and Affect: Mood normal.        Behavior: Behavior normal.        Thought Content: Thought content normal.        Judgment: Judgment normal.    Review of Systems  Constitutional: Negative.   HENT: Negative.    Eyes: Negative.   Respiratory: Negative.    Cardiovascular: Negative.   Gastrointestinal: Negative.   Genitourinary: Negative.   Musculoskeletal: Negative.   Skin: Negative.   Neurological: Negative.   Psychiatric/Behavioral:  Positive for depression and suicidal ideas. The patient is nervous/anxious.     Blood pressure 126/78, pulse 73, temperature 98.9 F (37.2 C), temperature source Oral, resp. rate 16, SpO2 98%. There is no height or weight on file to calculate BMI.  Past Psychiatric History: Bipolar disorder, MDD, GAD  Is the patient at risk to self? Yes  Has the patient been a risk to self in the past 6 months? No .    Has the patient been a risk to self within the distant past? No   Is the patient a risk to others? No   Has the patient been a risk to others in the past 6 months? No   Has the patient been a risk to others within the distant past? No   Past Medical History: See chart known  Family History: Known  Social History: Denies  Last Labs:  Admission on 09/05/2024  Component Date Value Ref Range Status   WBC 09/06/2024 9.0  4.0 - 10.5 K/uL Final   RBC 09/06/2024 4.63  3.87 - 5.11 MIL/uL Final   Hemoglobin 09/06/2024 13.3  12.0 - 15.0 g/dL Final   HCT 90/83/7974 40.4  36.0 - 46.0 % Final   MCV 09/06/2024 87.3  80.0 - 100.0 fL Final   MCH 09/06/2024 28.7  26.0 - 34.0 pg Final   MCHC 09/06/2024  32.9  30.0 - 36.0 g/dL Final   RDW 90/83/7974 13.2  11.5 - 15.5 % Final   Platelets 09/06/2024 368  150 - 400 K/uL Final   nRBC 09/06/2024 0.0  0.0 - 0.2 % Final   Neutrophils Relative % 09/06/2024 36  % Final   Neutro Abs 09/06/2024 3.3  1.7 - 7.7 K/uL Final   Lymphocytes Relative 09/06/2024 49  % Final   Lymphs Abs 09/06/2024 4.5 (H)  0.7 - 4.0 K/uL Final   Monocytes Relative 09/06/2024 11  % Final   Monocytes Absolute 09/06/2024 1.0  0.1 - 1.0 K/uL Final   Eosinophils Relative 09/06/2024 3  % Final   Eosinophils Absolute 09/06/2024 0.2  0.0 - 0.5 K/uL Final   Basophils Relative 09/06/2024 1  % Final   Basophils Absolute 09/06/2024 0.1  0.0 - 0.1 K/uL Final  Immature Granulocytes 09/06/2024 0  % Final   Abs Immature Granulocytes 09/06/2024 0.01  0.00 - 0.07 K/uL Final   Performed at [facility], [address] [place], [place] [address]   Sodium 09/06/2024 139  135 - 145 mmol/L Final   Potassium 09/06/2024 4.1  3.5 - 5.1 mmol/L Final   Chloride 09/06/2024 106  98 - 111 mmol/L Final   CO2 09/06/2024 21 (L)  22 - 32 mmol/L Final   Glucose, Bld 09/06/2024 81  70 - 99 mg/dL Final   Glucose reference range applies only to samples taken after fasting for at least 8 hours.   BUN 09/06/2024 8  6 - 20 mg/dL Final   Creatinine, Ser 09/06/2024 0.85  0.44 - 1.00 mg/dL Final   Calcium 90/83/7974 9.6  8.9 - 10.3 mg/dL Final   Total Protein 90/83/7974 6.8  6.5 - 8.1 g/dL Final   Albumin 90/83/7974 3.9  3.5 - 5.0 g/dL Final   AST 90/83/7974 21  15 - 41 U/L Final   ALT 09/06/2024 26  0 - 44 U/L Final   Alkaline Phosphatase 09/06/2024 61  38 - 126 U/L Final   Total Bilirubin 09/06/2024 0.2  0.0 - 1.2 mg/dL Final   GFR, Estimated 09/06/2024 >60  >60 mL/min Final   Comment: (NOTE) Calculated using the CKD-EPI Creatinine Equation (2021)    Anion gap 09/06/2024 12  5 - 15 Final   Performed at [facility], [address], [place], [place] [address]   Alcohol, Ethyl (B) 09/06/2024 <15   <15 mg/dL Final   Comment: (NOTE) For medical purposes only. Performed at [facility], [address], [place], [place] [address]    TSH 09/06/2024 1.354  0.350 - 4.500 uIU/mL Final   Comment: Performed by a 3rd Generation assay with a functional sensitivity of <=0.01 uIU/mL. Performed at [facility], [address], [place], [place] [address]     Allergies: Gluten meal and Orange peel extract monetta oil]  Medications:  Facility Ordered Medications  Medication   acetaminophen  (TYLENOL ) tablet 650 mg   alum & mag hydroxide-simeth (MAALOX/MYLANTA) 200-200-20 MG/5ML suspension 30 mL   magnesium  hydroxide (MILK OF MAGNESIA) suspension 30 mL   haloperidol  (HALDOL ) tablet 5 mg   And   diphenhydrAMINE  (BENADRYL ) capsule 50 mg   OLANZapine  (ZYPREXA ) injection 5 mg   OLANZapine  (ZYPREXA ) injection 10 mg   PTA Medications  Medication Sig   ibuprofen  (ADVIL ) 200 MG tablet Take 200 mg by mouth daily as needed for headache, mild pain (pain score 1-3) or moderate pain (pain score 4-6).   fluticasone  (FLONASE ) 50 MCG/ACT nasal spray Place 1 spray into both nostrils 2 (two) times daily. (Patient not taking: Reported on 07/10/2024)   norethindrone -ethinyl estradiol -FE (LOESTRIN  FE 1/20) 1-20 MG-MCG tablet Take 1 tablet by mouth daily.   naphazoline-glycerin  (CLEAR EYES REDNESS) 0.012-0.25 % SOLN Place 1-2 drops into both eyes 4 (four) times daily as needed for eye irritation.   hydrocortisone  cream 1 % Apply topically 2 (two) times daily. (Patient not taking: Reported on 07/10/2024)   mirtazapine  (REMERON ) 30 MG tablet Take 1 tablet (30 mg total) by mouth at bedtime. (Patient not taking: Reported on 07/10/2024)   hydrOXYzine  (ATARAX ) 10 MG tablet Take 1 tablet (10 mg total) by mouth 2 (two) times daily as needed for anxiety. (Patient not taking: Reported on 07/10/2024)   norethindrone -ethinyl estradiol -FE (LOESTRIN  FE) 1-20 MG-MCG tablet Take 1 tablet by mouth daily.  desvenlafaxine   (PRISTIQ ) 50 MG 24 hr tablet Take 1 tablet by mouth every night at bedtime (Patient not taking: Reported on 07/10/2024)   atomoxetine  (STRATTERA ) 40 MG capsule Take 1 capsule (40 mg total) by mouth in the morning.   ciprofloxacin -dexamethasone  (CIPRODEX ) OTIC suspension Instill 4 drops into affected ear 2 (two) times daily for 7 days. (Patient not taking: Reported on 07/10/2024)   hydrOXYzine  (VISTARIL ) 25 MG capsule Take 1 capsule (25 mg total) by mouth 3 (three) times daily as needed for anxiety. (Patient taking differently: Take 25 mg by mouth See admin instructions. Take 25 mg by mouth in the morning. May 25 mg twice daily as needed for anxiety.)   hydrOXYzine  (VISTARIL ) 25 MG capsule Take 1 capsule (25 mg total) by mouth 3 (three) times daily as needed for anxiety   atomoxetine  (STRATTERA ) 40 MG capsule Take 1 capsule (40 mg total) by mouth daily.   acetaminophen  (TYLENOL ) 500 MG tablet Take 500 mg by mouth every 6 (six) hours as needed for moderate pain (pain score 4-6).   lamoTRIgine  (LAMICTAL ) 25 MG tablet Take 2 tablets (50 mg total) by mouth daily.   norethindrone -ethinyl estradiol -iron (LOESTRIN  FE 1.5/30) 1.5-30 MG-MCG tablet Take 1 tablet by mouth daily.      Medical Decision Making  Observation unit    Recommendations  Based on my evaluation the patient does not appear to have an emergency medical condition.  Gaither Pouch, NP 09/06/24  4:08 AM

## 2024-09-05 NOTE — Progress Notes (Signed)
   09/05/24 2224  [facility] Triage Screening (Walk-ins at [facility] only)  How Did You Hear About Us ? Family/Friend  What Is the Reason for Your Visit/Call Today? Pt presents to [facility] as a voluntary walk-in, accompanied by her parents. Pt believes she does not need to be seen or evaluated at this time, but her parents have concerns about her well-being. Pt says that her parents have noticed a change in her behavior and she does not complete day to day activities as she normally would. Pt states  sometimes I just don't feel like doing things. PT is a Consulting civil engineer at [employer]  Per chart review, pt has history of MDD, SI and anxiety. Pt denies taking prescribed medications, nor does she have an outpatient therapist at this time. Pt reports past impatient hospitalization when she was in high school. Pt currently denies SI,HI,AVH and substance/alcohol use.  How Long Has This Been Causing You Problems? <Week  Have You Recently Had Any Thoughts About Hurting Yourself? No  Are You Planning to Commit Suicide/Harm Yourself At This time? No  Have you Recently Had Thoughts About Hurting Someone Sherral? No  Are You Planning To Harm Someone At This Time? No  Physical Abuse Denies  Verbal Abuse Denies  Sexual Abuse Denies  Exploitation of patient/patient's resources Denies  Self-Neglect Denies  Possible abuse reported to:  (NA)  Are you currently experiencing any auditory, visual or other hallucinations? No  Have You Used Any Alcohol or Drugs in the Past 24 Hours? No  Do you have any current medical co-morbidities that require immediate attention? No  Clinician description of patient physical appearance/behavior: flat affect, but cooperative, casually dressed  What Do You Feel Would Help You the Most Today? Social Support  If access to [facility] was not available, would you have sought care in the Emergency Department? No  Determination of Need Routine (7 days)  Options For Referral Other: Comment;Outpatient  Therapy;Medication Management

## 2024-09-06 ENCOUNTER — Other Ambulatory Visit (HOSPITAL_COMMUNITY): Payer: Self-pay

## 2024-09-06 DIAGNOSIS — F69 Unspecified disorder of adult personality and behavior: Secondary | ICD-10-CM

## 2024-09-06 DIAGNOSIS — Z793 Long term (current) use of hormonal contraceptives: Secondary | ICD-10-CM | POA: Diagnosis not present

## 2024-09-06 DIAGNOSIS — F339 Major depressive disorder, recurrent, unspecified: Secondary | ICD-10-CM | POA: Diagnosis not present

## 2024-09-06 DIAGNOSIS — F603 Borderline personality disorder: Secondary | ICD-10-CM | POA: Diagnosis not present

## 2024-09-06 DIAGNOSIS — F411 Generalized anxiety disorder: Secondary | ICD-10-CM | POA: Diagnosis not present

## 2024-09-06 DIAGNOSIS — Z79899 Other long term (current) drug therapy: Secondary | ICD-10-CM | POA: Diagnosis not present

## 2024-09-06 DIAGNOSIS — R45851 Suicidal ideations: Secondary | ICD-10-CM | POA: Diagnosis not present

## 2024-09-06 LAB — COMPREHENSIVE METABOLIC PANEL WITH GFR
ALT: 26 U/L (ref 0–44)
AST: 21 U/L (ref 15–41)
Albumin: 3.9 g/dL (ref 3.5–5.0)
Alkaline Phosphatase: 61 U/L (ref 38–126)
Anion gap: 12 (ref 5–15)
BUN: 8 mg/dL (ref 6–20)
CO2: 21 mmol/L — ABNORMAL LOW (ref 22–32)
Calcium: 9.6 mg/dL (ref 8.9–10.3)
Chloride: 106 mmol/L (ref 98–111)
Creatinine, Ser: 0.85 mg/dL (ref 0.44–1.00)
GFR, Estimated: >60 mL/min (ref >60)
Glucose, Bld: 81 mg/dL (ref 70–99)
Potassium: 4.1 mmol/L (ref 3.5–5.1)
Sodium: 139 mmol/L (ref 135–145)
Total Bilirubin: 0.2 mg/dL (ref 0.0–1.2)
Total Protein: 6.8 g/dL (ref 6.5–8.1)

## 2024-09-06 LAB — CBC WITH DIFFERENTIAL/PLATELET
Abs Immature Granulocytes: 0.01 K/uL (ref 0.00–0.07)
Basophils Absolute: 0.1 K/uL (ref 0.0–0.1)
Basophils Relative: 1 %
Eosinophils Absolute: 0.2 K/uL (ref 0.0–0.5)
Eosinophils Relative: 3 %
HCT: 40.4 % (ref 36.0–46.0)
Hemoglobin: 13.3 g/dL (ref 12.0–15.0)
Immature Granulocytes: 0 %
Lymphocytes Relative: 49 %
Lymphs Abs: 4.5 K/uL — ABNORMAL HIGH (ref 0.7–4.0)
MCH: 28.7 pg (ref 26.0–34.0)
MCHC: 32.9 g/dL (ref 30.0–36.0)
MCV: 87.3 fL (ref 80.0–100.0)
Monocytes Absolute: 1 K/uL (ref 0.1–1.0)
Monocytes Relative: 11 %
Neutro Abs: 3.3 K/uL (ref 1.7–7.7)
Neutrophils Relative %: 36 %
Platelets: 368 K/uL (ref 150–400)
RBC: 4.63 MIL/uL (ref 3.87–5.11)
RDW: 13.2 % (ref 11.5–15.5)
WBC: 9 K/uL (ref 4.0–10.5)
nRBC: 0 % (ref 0.0–0.2)

## 2024-09-06 LAB — ETHANOL: Alcohol, Ethyl (B): <15 mg/dL (ref <15)

## 2024-09-06 LAB — TSH: TSH: 1.354 u[IU]/mL (ref 0.350–4.500)

## 2024-09-06 MED ORDER — ESCITALOPRAM OXALATE 10 MG PO TABS: 10.0000 mg | ORAL_TABLET | Freq: Every day | ORAL | Status: DC

## 2024-09-06 MED ORDER — ESCITALOPRAM OXALATE 10 MG PO TABS: 10.0000 mg | ORAL_TABLET | Freq: Once | ORAL | Status: DC

## 2024-09-06 MED ORDER — ESCITALOPRAM OXALATE 10 MG PO TABS
10.0000 mg | ORAL_TABLET | Freq: Every day | ORAL | 0 refills | Status: DC
  Filled 2024-09-06 – 2024-09-08 (×2): qty 30, 30d supply, fill #0

## 2024-09-06 MED ORDER — SERTRALINE HCL 25 MG PO TABS
25.0000 mg | ORAL_TABLET | Freq: Every day | ORAL | Status: DC
Start: 2024-09-06 — End: 2024-09-06

## 2024-09-06 MED ORDER — SERTRALINE HCL 25 MG PO TABS
25.0000 mg | ORAL_TABLET | Freq: Every day | ORAL | 0 refills | Status: DC
  Filled 2024-09-06: qty 30, 30d supply, fill #0

## 2024-09-06 NOTE — BH Assessment (Signed)
 Comprehensive Clinical Assessment (CCA) Note   09/06/2024 Betty Davis [id number]  Disposition: Per Gaither Pouch, NP, patient is recommended for overnight observation with re-evaluation in the morning.    The patient demonstrates the following risk factors for suicide: Chronic risk factors for suicide include: psychiatric disorder of Major Depressive Disorder . Acute risk factors for suicide include: family or marital conflict. Protective factors for this patient include: positive social support. Considering these factors, the overall suicide risk at this point appears to be low. Patient is appropriate for outpatient follow up.   Patient is a 18 year old female with a history of Major Depressive Disorder who presents voluntarily to [facility] for an assessment. Patient resides in the home with her parents and identifies them as their primary support system.Patient reports crying spells, irritability, hopelessness, change in appetite. Patient denies history of past suicide attempts .  Patient denies hx of Substance abuse. Patient denies NSSIB, SI, HI, AVH.  Patient refuse to share what her primary stressors are at this time. Patient denies history of abuse or trauma. Patient denies current legal problems. Patient is receiving outpatient therapy with Dominique Hickman. Patient reports she takes her medications as prescribed (see MAR) and denies recent medication changes. Patient denies previous inpatient admission.  Patient denies access to weapons.   Patient can to contract for safety outside of the hospital.  Patient gives verbal consent for [facility] to speak with her mother.  Per mother patient is struggling with depression.   Treatment options were discussed and patient is in agreement with recommendation for overnight observation.   During evaluation pt is in no acute distress. She is alert, oriented x 4, tearful, cooperative and attentive. Her mood is depressed, flat, and  tearful with congruent affect. She has normal speech, and behavior.  Objectively there is no evidence of psychosis/mania or delusional thinking.  Patient is able to converse coherently, goal directed thoughts, no distractibility, or pre-occupation.   She also denies suicidal/self-harm/homicidal ideation, psychosis, and paranoia.  Patient answered question appropriately.       Chief Complaint:  Chief Complaint  Patient presents with   Depression   Visit Diagnosis: Major Depressive Disorder     CCA Screening, Triage and Referral (STR)  Patient Reported Information How did you hear about us ? Family/Friend  What Is the Reason for Your Visit/Call Today? Pt presents to [facility] as a voluntary walk-in, accompanied by her parents. Pt believes she does not need to be seen or evaluated at this time, but her parents have concerns about her well-being. Pt says that her parents have noticed a change in her behavior and she does not complete day to day activities as she normally would. Pt states  sometimes I just don't feel like doing things. PT is a Consulting civil engineer at [employer]  Per chart review, pt has history of MDD, SI and anxiety. Pt denies taking prescribed medications, nor does she have an outpatient therapist at this time. Pt reports past impatient hospitalization when she was in high school. Pt currently denies SI,HI,AVH and substance/alcohol use.  How Long Has This Been Causing You Problems? <Week  What Do You Feel Would Help You the Most Today? Social Support   Have You Recently Had Any Thoughts About Hurting Yourself? No  Are You Planning to Commit Suicide/Harm Yourself At This time? No   Flowsheet Row ED from 09/05/2024 in [facility] ED from [facility]/11/2024 in [facility]  C-SSRS RISK CATEGORY No Risk No Risk High Risk    Have you Recently Had Thoughts About  Hurting Someone [place]? No  Are You Planning to Harm Someone at This Time? No  Explanation: Pt denies HI   Have You Used Any Alcohol or Drugs in the Past 24 Hours? No  How Long Ago Did You Use Drugs or Alcohol?n/a What Did You Use and How Much? yesterday - half of a [place]   Do You Currently Have a Therapist/Psychiatrist? Yes  Name of Therapist/Psychiatrist: Name of Therapist/Psychiatrist: Pt reports her therapist is Rosaria Baron, LCSW   Have You Been Recently Discharged From Any Office Practice or Programs? No  Explanation of Discharge From Practice/Program: Recently (01/22/24) discharged from [facility]     CCA Screening Triage Referral Assessment Type of Contact: Face-to-Face  Telemedicine Service Delivery:   Is this Initial or Reassessment?   Date Telepsych consult ordered in [facility]:    Time Telepsych consult ordered in [facility]:    Location of Assessment: [facility]  Provider Location: [facility] Assessment Services   Collateral Involvement: PENDER-Bovard,NAKISHA (Mother)  [phone]   Does Patient Have a Court Appointed Legal Guardian? No  Legal Guardian Contact Information: n/a  Copy of Legal Guardianship Form: -- (n/a)  Legal Guardian Notified of Arrival: -- (n/a)  Legal Guardian Notified of Pending Discharge: -- (n/a)  If Minor and Not Living with Parent(s), Who has Custody? n/a  Is CPS involved or ever been involved? Never  Is APS involved or ever been involved? Never   Patient Determined To Be At Risk for Harm To Self or Others Based on Review of Patient Reported Information or Presenting Complaint? No  Method: No Plan  Availability of Means: No access or NA  Intent: Vague intent or NA  Notification Required: No need or identified person  Additional Information for Danger to Others Potential: -- (n/a)  Additional Comments for Danger to Others Potential: n/a  Are There Guns or Other Weapons in Your Home? No  Types of  Guns/Weapons: Denies access  Are These Weapons Safely Secured?                            No  Who Could Verify You Are Able To Have These Secured: Denies access  Do You Have any Outstanding Charges, Pending Court Dates, Parole/Probation? Pt denies pending legal charges  Contacted To Inform of Risk of Harm To Self or Others: -- (n/a)    Does Patient Present under Involuntary Commitment? No    [place] of Residence: [place]   Patient Currently Receiving the Following Services: Medication Management; Individual Therapy   Determination of Need: Routine (7 days)   Options For Referral: Other: Comment; Outpatient Therapy; Medication Management     CCA Biopsychosocial Patient Reported Schizophrenia/Schizoaffective Diagnosis in Past: No   Strengths: none reporteed   Mental Health Symptoms Depression:  Change in energy/activity; Fatigue; Hopelessness; Tearfulness; Sleep (too much or little); Irritability   Duration of Depressive symptoms: Duration of Depressive Symptoms: Greater than two weeks   Mania:  None   Anxiety:   Worrying; Tension   Psychosis:  None   Duration of Psychotic symptoms:    Trauma:  None   Obsessions:  None   Compulsions:  None   Inattention:  Does not seem to listen (per history)   Hyperactivity/Impulsivity:  None   Oppositional/Defiant Behaviors:  None   Emotional  Irregularity:  Mood lability   Other Mood/Personality Symptoms:  None    Mental Status Exam Appearance and self-care  Stature:  Average   Weight:  Average weight   Clothing:  Casual; Neat/clean (Covered by blanket)   Grooming:  Normal   Cosmetic use:  Age appropriate   Posture/gait:  Normal   Motor activity:  Not Remarkable   Sensorium  Attention:  Normal   Concentration:  Normal   Orientation:  X5   Recall/memory:  Normal   Affect and Mood  Affect:  Depressed; Flat; Tearful   Mood:  Depressed   Relating  Eye contact:  Normal   Facial expression:   Depressed   Attitude toward examiner:  Cooperative; Guarded; Dramatic   Thought and Language  Speech flow: Normal   Thought content:  Appropriate to Mood and Circumstances   Preoccupation:  None   Hallucinations:  None   Organization:  Coherent   [employer] of Knowledge:  Average   Intelligence:  Average   Abstraction:  Functional   Judgement:  Poor   Reality Testing:  Adequate   Insight:  Lacking; Poor   Decision Making:  Impulsive   Social Functioning  Social Maturity:  Impulsive   Social Judgement:  Naive   Stress  Stressors:  Grief/losses; Relationship; School   Coping Ability:  Exhausted; Overwhelmed   Skill Deficits:  Decision making; Communication; Interpersonal; Responsibility; Self-control   Supports:  Family; Friends/Service system; Support needed     Religion: Religion/Spirituality Are You A Religious Person?: Yes What is Your Religious Affiliation?: Christian How Might This Affect Treatment?: NA  Leisure/Recreation: Leisure / Recreation Do You Have Hobbies?: No Leisure and Hobbies: None reported  Exercise/Diet: Exercise/Diet Do You Exercise?: Yes What Type of Exercise Do You Do?: Run/Walk How Many Times a Week Do You Exercise?: 1-3 times a week Have You Gained or Lost A Significant Amount of Weight in the Past Six Months?: No Do You Follow a Special Diet?: No Do You Have Any Trouble Sleeping?: No   CCA Employment/Education Employment/Work Situation: Employment / Work Situation Employment Situation: Employed Work Stressors: concern about losing her job if she stays in the hospital Patient's Job has Been Impacted by Current Illness: No Has Patient ever Been in the [employer]?: No  Education: Education Is Patient Currently Attending School?: Yes School Currently Attending: [employer] Last Grade Completed: 12 Did You Product manager?: No Did You Have An Individualized Education Program (IIEP): No Did You Have Any  Difficulty At School?: No (bullied in 9th and 10th grades) Patient's Education Has Been Impacted by Current Illness: No   CCA Family/Childhood History Family and Relationship History: Family history Marital status: Single Does patient have children?: No  Childhood History:  Childhood History By whom was/is the patient raised?: Both parents Did patient suffer any verbal/emotional/physical/sexual abuse as a child?: No Did patient suffer from severe childhood neglect?: No Has patient ever been sexually abused/assaulted/raped as an adolescent or adult?: No Was the patient ever a victim of a crime or a disaster?: No Witnessed domestic violence?: No Has patient been affected by domestic violence as an adult?: No       CCA Substance Use Alcohol/Drug Use: Alcohol / Drug Use Pain Medications: See MAR Prescriptions: See MAR Over the Counter: See MAR History of alcohol / drug use?: No history of alcohol / drug abuse Longest period of sobriety (when/how long): NA Negative Consequences of Use:  (none reported) Withdrawal Symptoms:  (none reported)  ASAM's:  Six Dimensions of Multidimensional Assessment  Dimension 1:  Acute Intoxication and/or Withdrawal Potential:   Dimension 1:  Description of individual's past and current experiences of substance use and withdrawal: none reported  Dimension 2:  Biomedical Conditions and Complications:   Dimension 2:  Description of patient's biomedical conditions and  complications: none reported  Dimension 3:  Emotional, Behavioral, or Cognitive Conditions and Complications:  Dimension 3:  Description of emotional, behavioral, or cognitive conditions and complications: Hx of MDD, GAD and borderline traits  Dimension 4:  Readiness to Change:     Dimension 5:  Relapse, Continued use, or Continued Problem Potential:  Dimension 5:  Relapse, continued use, or continued problem potential critiera description: none  reported  Dimension 6:  Recovery/Living Environment:     ASAM Severity Score: ASAM's Severity Rating Score: 0  ASAM Recommended Level of Treatment: ASAM Recommended Level of Treatment:  (n/a)   Substance use Disorder (SUD) Substance Use Disorder (SUD)  Checklist Symptoms of Substance Use:  (n/a)  Recommendations for Services/Supports/Treatments: Recommendations for Services/Supports/Treatments Recommendations For Services/Supports/Treatments: Individual Therapy, Medication Management, Other (Comment) (Overnight Observation)  Disposition Recommendation per psychiatric provider: Overnight Observation   DSM5 Diagnoses: Patient Active Problem List   Diagnosis Date Noted   Adjustment disorder with mixed anxiety and depressed mood 07/10/2024   Bipolar disorder (HCC) 07/10/2024   At risk for self injurious behavior 01/22/2024   Behavior problem in child 02/17/2022   MDD (major depressive disorder), recurrent severe, without psychosis (HCC) 09/30/2021   ADHD, hyperactive-impulsive type 07/25/2018     Referrals to Alternative Service(s): Referred to Alternative Service(s):   Place:   Date:   Time:    Referred to Alternative Service(s):   Place:   Date:   Time:    Referred to Alternative Service(s):   Place:   Date:   Time:    Referred to Alternative Service(s):   Place:   Date:   Time:     Rosina PARAS, [place], [facility]

## 2024-09-06 NOTE — ED Notes (Signed)
 Pt in bed w/eyes closed resting quietly. Respirations equal and unlabored. No signs or symptoms of distress. Routine safety checks maintained/ongoing.

## 2024-09-06 NOTE — ED Notes (Signed)
 Pt A&Ox4, sad and cooperative. Pt discharge focused and worried about getting out of here to go to work because she is afraid of losing her job. Denies SI/HI/AVH. Contracts for safety. Encouragement and support given. Will continue to monitor.

## 2024-09-06 NOTE — ED Notes (Signed)
 Pt sleeping at this time. Rise and fall of chest noted. Pt in NAD at this time. Will continue to monitor.

## 2024-09-06 NOTE — ED Provider Notes (Addendum)
 FBC/OBS ASAP Discharge Summary  Date and Time: 09/06/2024 1:07 PM  Name: Betty Davis  MRN:  [id number]   Discharge Diagnoses:  Final diagnoses:  Recurrent major depressive disorder, remission status unspecified (HCC)  Passive suicidal ideations  Behavior concern in adult    Subjective: Betty Davis, 18 y/o female with a history or MDD, GAD and most recently bipolar dx ,  presented [facility], companied by her mother.  Per the mother patient has not been acting herself, she is not completing day-to-day activity as usual and does need long-term inpatient.   Patient was seen on reassessment with medical student. Pt reported long history of mental health struggles with depression and anxiety. Pt reported inconsistent history of medication compliance. Multiple hospitalizations, most recent 12/2023. Multiple PHPs. Pt has current med provider, but has discontinued relationship with previous therapist.  Pt denied substance use, SI, HI, AVH.   Collateral information obtained (PENDER-Bostrom,NAKISHA , phone number: patient's mother) Patient granted permission to speak to contact person without restrictions. Spoke with patient's mother in person. Confirmed patient details via: Name, Birth date , and Relationship  Main Content: Spoke with patient's mother at length.  She reports that the daughter has a significant history of difficult to treat mental illness including both depression, anxiety, mood lability.  Patient was diagnosed with bipolar 1 disorder at some point this summer, as well as borderline personality disorder, however mother is not sure about these diagnoses.  Patient does exhibit a fair degree of mood lability, however the patient does not have any history consistent with a bipolar picture.  Mood swings are not a valid diagnostic indicator of bipolar disorder.  Patient has poor history of medication and therapeutic compliance.  Does exhibit a fair degree of help seeking/help rejecting behaviors.   Patient's frequent instability has led to challenging relationships with her family including her older sister living in [place] , and her parents living here in [place].  Known medication allergies?  NKDA Is contact aware of statements from patient that indicate intent/plan to self harm?  Denies, no history Is contact aware of patient's adherence to prescribed medications?  Poor.  Collateral contact denies presence of firearms or large stockpiles of pills at home.   During this conversation, I explained in simple terms the patient's mental health condition, answered questions pertaining to the patient's current treatment and provided updates, outlined the treatment plan moving forward, provided guidance on safety planning (ie securing firearms, safe medication allocation, etc), coordinated plans for future disposition and recommended follow-up, and directed involved parties to available resources in the event of patient decompensating.  09/06/2024 4:03 PM   Stay Summary: Pt admitted overnight. Discharged next day.   Total Time spent with patient: 1 hour  Past Psychiatric History: MDD, GAD, questionable bipolar disorder, questionable personality disorder. Past Medical History: None significant. Family History:  Family Psychiatric History: depression, anxiety in mother. 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  She has been a long-term, long-distance relationship with a freshman at the [employer].  She lives at home with her mother and father.  Reports relationship with her mother very good reports relationship with her father as okay patient exercises 5 to 7 days a week several days a week with her father including both aerobic and yoga exercises.  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  Patient also engages in volunteering.  Patient is interested in becoming involved in  sports medicine. Tobacco  Cessation:  N/A, patient does not currently use tobacco products  Current Medications:  Current Facility-Administered Medications  Medication Dose Route Frequency Provider Last Rate Last Admin   acetaminophen  (TYLENOL ) tablet 650 mg  650 mg Oral Q6H PRN Trudy Carwin, NP       alum & mag hydroxide-simeth (MAALOX/MYLANTA) 200-200-20 MG/5ML suspension 30 mL  30 mL Oral Q4H PRN Trudy Carwin, NP       haloperidol  (HALDOL ) tablet 5 mg  5 mg Oral TID PRN Trudy Carwin, NP       And   diphenhydrAMINE  (BENADRYL ) capsule 50 mg  50 mg Oral TID PRN Trudy Carwin, NP       escitalopram  (LEXAPRO ) tablet 10 mg  10 mg Oral Daily Delsie Lynwood Morene Lavone, MD       magnesium  hydroxide (MILK OF MAGNESIA) suspension 30 mL  30 mL Oral Daily PRN Trudy Carwin, NP       OLANZapine  (ZYPREXA ) injection 10 mg  10 mg Intramuscular TID PRN Trudy Carwin, NP       OLANZapine  (ZYPREXA ) injection 5 mg  5 mg Intramuscular TID PRN Trudy Carwin, NP       Current Outpatient Medications  Medication Sig Dispense Refill   atomoxetine  (STRATTERA ) 40 MG capsule Take 1 capsule (40 mg total) by mouth daily. 30 capsule 0   fluticasone  (FLONASE ) 50 MCG/ACT nasal spray Place 1 spray into both nostrils 2 (two) times daily. (Patient taking differently: Place 1 spray into both nostrils 2 (two) times daily as needed for allergies.) 48 g 1   hydrOXYzine  (VISTARIL ) 25 MG capsule Take 1 capsule (25 mg total) by mouth 3 (three) times daily as needed for anxiety 270 capsule 0   norethindrone -ethinyl estradiol -iron (LOESTRIN  FE 1.5/30) 1.5-30 MG-MCG tablet Take 1 tablet by mouth daily. 28 tablet 1   atomoxetine  (STRATTERA ) 40 MG capsule Take 1 capsule (40 mg total) by mouth in the morning. 90 capsule 3   escitalopram  (LEXAPRO ) 10 MG tablet Take 1 tablet (10 mg total) by mouth daily. 30 tablet 0    PTA Medications:  PTA Medications  Medication Sig   fluticasone  (FLONASE ) 50 MCG/ACT nasal spray Place 1 spray  into both nostrils 2 (two) times daily. (Patient taking differently: Place 1 spray into both nostrils 2 (two) times daily as needed for allergies.)   hydrOXYzine  (VISTARIL ) 25 MG capsule Take 1 capsule (25 mg total) by mouth 3 (three) times daily as needed for anxiety   atomoxetine  (STRATTERA ) 40 MG capsule Take 1 capsule (40 mg total) by mouth daily.   norethindrone -ethinyl estradiol -iron (LOESTRIN  FE 1.5/30) 1.5-30 MG-MCG tablet Take 1 tablet by mouth daily.   atomoxetine  (STRATTERA ) 40 MG capsule Take 1 capsule (40 mg total) by mouth in the morning.   escitalopram  (LEXAPRO ) 10 MG tablet Take 1 tablet (10 mg total) by mouth daily.   Facility Ordered Medications  Medication   acetaminophen  (TYLENOL ) tablet 650 mg   alum & mag hydroxide-simeth (MAALOX/MYLANTA) 200-200-20 MG/5ML suspension 30 mL   magnesium  hydroxide (MILK OF MAGNESIA) suspension 30 mL   haloperidol  (HALDOL ) tablet 5 mg   And   diphenhydrAMINE  (BENADRYL ) capsule 50 mg   OLANZapine  (ZYPREXA ) injection 5 mg   OLANZapine  (ZYPREXA ) injection 10 mg   escitalopram  (LEXAPRO ) tablet 10 mg       09/29/2021    6:34 PM  Depression screen PHQ 2/9  Decreased Interest 1  Down, Depressed, Hopeless 3  PHQ - 2 Score 4  Altered sleeping 3  Tired, decreased  energy 1  Change in appetite 1  Feeling bad or failure about yourself  2  Trouble concentrating 2  Moving slowly or fidgety/restless 0  Suicidal thoughts 1  PHQ-9 Score 14  Difficult doing work/chores Somewhat difficult    Flowsheet Row ED from 09/05/2024 in [facility] ED from [facility]/11/2024 in [facility]Dis[facility]  C-SSRS RISK CATEGORY No Risk No Risk High Risk    Musculoskeletal  Strength & Muscle Tone: within normal limits Gait & Station: normal Patient leans: N/A  Psychiatric Specialty Exam  Presentation  General Appearance:   Casual  Eye Contact: Good  Speech: Clear and Coherent  Speech Volume: Normal  Handedness: Right   Mood and Affect  Mood: Anxious; Depressed  Affect: Congruent   Thought Process  Thought Processes: Coherent  Descriptions of Associations:Intact  Orientation:Full (Time, Place and Person)  Thought Content:Logical  Diagnosis of Schizophrenia or Schizoaffective disorder in past: No    Hallucinations:Hallucinations: None  Ideas of Reference:None  Suicidal Thoughts:Suicidal Thoughts: Yes, Passive SI Passive Intent and/or Plan: Without Plan; Without Intent  Homicidal Thoughts:Homicidal Thoughts: No   Sensorium  Memory: Immediate Fair  Judgment: Fair  Insight: Fair   Chartered certified accountant: Fair  Attention Span: Fair  Recall: Fiserv of Knowledge: Fair  Language: Fair   Psychomotor Activity  Psychomotor Activity: Psychomotor Activity: Normal   Assets  Assets: Desire for Improvement; Resilience; Vocational/Educational   Sleep  Sleep: Sleep: Fair  No Safety Checks orders active in given range  Nutritional Assessment (For OBS and FBC admissions only) Has the patient had a weight loss or gain of 10 pounds or more in the last 3 months?: No Has the patient had a decrease in food intake/or appetite?: No Does the patient have dental problems?: No Does the patient have eating habits or behaviors that may be indicators of an eating disorder including binging or inducing vomiting?: No Has the patient recently lost weight without trying?: 0 Has the patient been eating poorly because of a decreased appetite?: 0 Malnutrition Screening Tool Score: 0    Physical Exam  Physical Exam ROS Blood pressure (!) 117/54, pulse (!) 109, temperature 98.5 F (36.9 C), temperature source Oral, resp. rate 16, SpO2 99%. There is no height or weight on file to calculate BMI.  Demographic Factors:  Adolescent or young adult  Suicide  Risk Assessment:  Suicidal ideation/thoughts:  []  Current  []  Recent  [x]  Denies   Intention to act or plan:       []  Current  []  Recent [x]  Denies   Preparatory behavior:  []  Recent  [x]  Denies   Suicide attempts:             []  Remote    []  Recent  [x]  Denies   []  Multiple   Acute risk factors: AcuteSuicideRiskFactors : Severe anxiety/panic and Recent impulsivity or acting recklessly  Chronic/static risk factors: Cluster B personality disorder/traits, Emotional lability, Poor coping or problem solving skills, Single, Separated, or Divorced, and Medical illness  Protective factors: ProtectiveFactors : Currently denies SI/intent, Sense of purpose, Optimism, hopefulness, Future-oriented, realistic life goals/plans, Community engagement, Strong family/social connections, Access to healthcare resources, and Willingness to seek help  Potential future factors: None  Summary: Though it is impossible to accurately predict with absolute certainty future events and human behaviors, an assessment of current suicidal indicators, risk factors, and protective factors suggests that this patient's:  Acute suicide risk is: mild in degree .   Chronic suicide risk is mild in degree. Increases with substance/alcohol use and acute intoxication.   Plan Of Care/Follow-up recommendations:  Patient is recommended to establish with new psychiatric and psychotherapeutic providers. Pt may need to attempt several before another one fits.   Starting patient on lexapro  (agent currently helping the mother). Recommend increasing dose as tolerated.  Disposition: Home with parent.  Lynwood Morene Lavone Delsie, MD 09/06/2024, 1:07 PM

## 2024-09-06 NOTE — ED Notes (Signed)
 Pt denies SI/HI/AVH. Verbally contracts for safety provided. Pt presents as sad; while concerned w/losing her job d/t on schedule to work 09/06/2024. Skin check performed w/no significant findings. PT searched and no contraband found, POC and unit policies explained and understanding verbalized. Food and fluids offered, both declined d/t allergies to gluten. Pt had no additional questions or concerns. Safety maintained/ongoing.

## 2024-09-06 NOTE — Discharge Instructions (Addendum)
 It is imperative that you follow through with treatment recommendations within 5-7 days from the day of discharge to mitigate further risk to your safety and overall mental well-being.  A list of outpatient therapy and psychiatric providers for medication management has been provided below to get you started in finding the right provider for you.            [facility] [address] [place], [place], [address] [phone] phone (Medicare, Private insurance except Tricare, [place], and [facility])  [facility] [address] [place], [place], [address] [address] phone ([address] AmeriHealth [facility] [place], [address], [place], [place], Friday Health Plans, [address], BCBS Healthy [place], [place], [address], [place], [place], [place], [place], Tricare, [facility], Safeco Corporation, Eli Lilly and Company)  [employer] [phone], Suite [place], [place], [address] [phone] phone [phone] phone [phone] fax  [facility] [address] [place], [place], [address] [phone] phone (Call to confirm insurance coverage) Consultation & Support Services     o Drop-In Hours: 1:00 PM to 5:00 PM     o Days: Monday - Thursday  Crisis Services (24/7)   [facility] [address] [place], [place], [address] [phone] phone ([address] [place], [place], [place] Medicaid, [place] and [place], [facility])      [facility] [address], [address] [place], [place], [address] [phone] phone [email or web address]  (to complete the intake form and upload ID and insurance cards)  [facility] [address] [place], [place], [address] [phone] phone ([address], [place], [address] [employer], [place], [place], [place], [facility], [place], and certain Medicaid plans)  [facility] [phone] [address], [address] [place], [place], [address] [phone] phone [phone] fax (Medicaid, Medicare, Self-pay, call about other insurance coverage)  [facility] (age 40+) [address] [place], [place], [address] [phone] hone [phone] fax ([place], [address], [place], [place], [place], [address], [place], [place], [place], [place], certain Ryland Group, [facility], UMR)  [employer] [address], [place], [address] [phone] phone (Medicare, Medicaid, [place], call about other insurance coverage)  [facility] [address] [place], [place], [address] [phone] phone [phone] fax (Call [phone] to see what insurance is accepted) Georgean Lo, MD specializes in geropsych)  [facility], [facility]  (medication management only) [address] [place], [place], [address] [phone] phone [phone] fax ([address] Medicaid, [place], [place], [place], [place], [place], [place], [place])  [facility] (medication management only) [address], [address] [place], [place], [address] [phone] phone [phone] fax ([address], Medicare, [place], [place], Tricare [place])  [facility] [address] [place], [place], [address] [phone] phone [phone] fax ([address], [place], [place], [place], [place], [facility], [facility] Medicaid/[place] Health Choice)  [facility] [place], [employer]. [address] [place], [place], [address] [phone] phone [phone] fax (Medicare, Medicaid, [facility])  [facility] [address] [place], [place] [address] [phone] phone ([address] [place], [place], CBHA, MedCost, Medicare, [place], [facility]) Does genetic testing for medications; does transcranial magnetic stimulation along with basic services)  [facility] [address] [place], [place],  [address] [phone] phone (Call about insurance coverage)  [facility] [address] [place], [place], [address] [phone] phone [phone] fax (Call about insurance coverage)  [facility] [address] [place], [place], [address] [phone] phone [phone]  fax (Call about insurance coverage)  [employer] [phone] N. [address] [place], [address] [phone] phone (Medicaid, Tricare, [place], [place], [place])  [employer] [address] [place], [place], [address] [phone] phone (Medicaid, Medicare, call about other insurance coverage)  [facility] [address] [place], [place], [address] [phone] phone [phone] fax (Medicaid, Medicare, Tricare, call about other insurance coverage)  [facility] [address] [place], [address] [phone] phone ([address], [place], [place], [place], [place] types - Alliance, Secretary/administrator, Partners, [place], [place] Health Choice, Healthy [place], [place], [place], and Complete)  [facility] [address] [place], [place], [address] [phone] phone Completely online treatment platform Contact: Izola So - [facility] Specialist [phone] phone [phone] fax ([address] [place], [place], Friday Health Plan, [place], [place], [place], [place], [place], [place] Optum ____ Outpatient Therapy and Psychiatry Resources for Patients: Your psychiatric needs would be well-served by consultation and regular meetings with an outpatient therapist to assist you with your mood-related conditions. Here are a series of links for finding a therapist.    Includes links to the following: [facility] (http://wilson-mayo.com/) (only for [facility] and please reserve for uninsured) [facility] (http://blankenship-martinez.net/) Psychology Today Special educational needs teacher (https://www.psychologytoday.com/us lendell) Psychology Today Support Group Tax inspector (https://www.psychologytoday.com/us /groups/) [employer] - [employer] Location (https://carolinabehavioralcare.com/staff-location/[place]/) [facility] [place] - Support Group Finder - (RecordDebt.fi) Family Services of the Motorola - Lexicographer (https://fspcares.org/contact/) The First American for Mental Health [place] - NAMI (https://namiguilford.org/support-and-education/support-groups/) Interior and spatial designer Health - Affiliated with [facility] (https://www.Fillmore.com/lb/locations/profile/cone-health-[place]-behavioral-medicine-at-[address]/) Dept of Health and Health and safety inspector - Find a mental health facility (http://lester.info/) _ Learning about therapy: The term therapy can mean many things. There are many different types of psychotherapy, some are short-term, others take a longer period of time. A good therapist will meet with you, discuss your goals, and help you set a treatment plan that addresses your major concerns, regardless of what type of therapy they practice. If the first therapist is not a great fit, try another one.   The American Psychological Association (the APA) has resources to learn about different kinds of therapy.  Here is a link to learn more about the different kinds of psychotherapy:   Gymville.si _ Mental Health Crisis Resources:

## 2024-09-08 ENCOUNTER — Other Ambulatory Visit (HOSPITAL_COMMUNITY): Payer: Self-pay

## 2024-09-19 ENCOUNTER — Ambulatory Visit: Admitting: Psychology

## 2024-09-29 ENCOUNTER — Other Ambulatory Visit (HOSPITAL_COMMUNITY): Payer: Self-pay

## 2024-09-29 ENCOUNTER — Other Ambulatory Visit: Admitting: Psychology

## 2024-09-29 MED ORDER — NORETHIN ACE-ETH ESTRAD-FE 1.5-30 MG-MCG PO TABS
1.0000 | ORAL_TABLET | Freq: Every day | ORAL | 3 refills | Status: AC
  Filled 2024-09-29: qty 28, 28d supply, fill #0
  Filled 2024-10-28 – 2024-11-01 (×4): qty 28, 28d supply, fill #1
  Filled 2025-01-12: qty 28, 28d supply, fill #2

## 2024-10-02 ENCOUNTER — Emergency Department (HOSPITAL_COMMUNITY)

## 2024-10-02 ENCOUNTER — Other Ambulatory Visit (HOSPITAL_COMMUNITY): Payer: Self-pay

## 2024-10-02 ENCOUNTER — Inpatient Hospital Stay (HOSPITAL_COMMUNITY)

## 2024-10-02 ENCOUNTER — Other Ambulatory Visit: Payer: Self-pay

## 2024-10-02 ENCOUNTER — Encounter (HOSPITAL_COMMUNITY): Payer: Self-pay

## 2024-10-02 ENCOUNTER — Inpatient Hospital Stay (HOSPITAL_COMMUNITY)
Admission: EM | Admit: 2024-10-02 | Discharge: 2024-10-03 | DRG: 552 | Disposition: A | Discharge status: Home / Self Care | Attending: Surgery | Admitting: Surgery

## 2024-10-02 DIAGNOSIS — Z79899 Other long term (current) drug therapy: Secondary | ICD-10-CM

## 2024-10-02 DIAGNOSIS — S32048A Other fracture of fourth lumbar vertebra, initial encounter for closed fracture: Secondary | ICD-10-CM | POA: Diagnosis not present

## 2024-10-02 DIAGNOSIS — F901 Attention-deficit hyperactivity disorder, predominantly hyperactive type: Secondary | ICD-10-CM | POA: Diagnosis present

## 2024-10-02 DIAGNOSIS — Y92410 Unspecified street and highway as the place of occurrence of the external cause: Secondary | ICD-10-CM | POA: Diagnosis not present

## 2024-10-02 DIAGNOSIS — S32040A Wedge compression fracture of fourth lumbar vertebra, initial encounter for closed fracture: Secondary | ICD-10-CM | POA: Diagnosis not present

## 2024-10-02 DIAGNOSIS — F4323 Adjustment disorder with mixed anxiety and depressed mood: Secondary | ICD-10-CM | POA: Diagnosis present

## 2024-10-02 DIAGNOSIS — M545 Low back pain, unspecified: Secondary | ICD-10-CM | POA: Diagnosis present

## 2024-10-02 DIAGNOSIS — Z809 Family history of malignant neoplasm, unspecified: Secondary | ICD-10-CM

## 2024-10-02 DIAGNOSIS — S32058A Other fracture of fifth lumbar vertebra, initial encounter for closed fracture: Secondary | ICD-10-CM | POA: Diagnosis not present

## 2024-10-02 DIAGNOSIS — Z3202 Encounter for pregnancy test, result negative: Secondary | ICD-10-CM | POA: Diagnosis not present

## 2024-10-02 DIAGNOSIS — Z91018 Allergy to other foods: Secondary | ICD-10-CM | POA: Diagnosis not present

## 2024-10-02 DIAGNOSIS — S9031XA Contusion of right foot, initial encounter: Secondary | ICD-10-CM | POA: Diagnosis not present

## 2024-10-02 DIAGNOSIS — M4850XA Collapsed vertebra, not elsewhere classified, site unspecified, initial encounter for fracture: Secondary | ICD-10-CM | POA: Diagnosis present

## 2024-10-02 DIAGNOSIS — S32050A Wedge compression fracture of fifth lumbar vertebra, initial encounter for closed fracture: Secondary | ICD-10-CM | POA: Diagnosis not present

## 2024-10-02 DIAGNOSIS — S32009A Unspecified fracture of unspecified lumbar vertebra, initial encounter for closed fracture: Principal | ICD-10-CM

## 2024-10-02 DIAGNOSIS — N3001 Acute cystitis with hematuria: Secondary | ICD-10-CM | POA: Diagnosis present

## 2024-10-02 LAB — CBC WITH DIFFERENTIAL/PLATELET
Abs Immature Granulocytes: 0.18 K/uL — ABNORMAL HIGH (ref 0.00–0.07)
Basophils Absolute: 0.1 K/uL (ref 0.0–0.1)
Basophils Relative: 1 %
Eosinophils Absolute: 0.1 K/uL (ref 0.0–0.5)
Eosinophils Relative: 1 %
HCT: 37.6 % (ref 36.0–46.0)
Hemoglobin: 12.4 g/dL (ref 12.0–15.0)
Immature Granulocytes: 2 %
Lymphocytes Relative: 42 %
Lymphs Abs: 4.5 K/uL — ABNORMAL HIGH (ref 0.7–4.0)
MCH: 29 pg (ref 26.0–34.0)
MCHC: 33 g/dL (ref 30.0–36.0)
MCV: 87.9 fL (ref 80.0–100.0)
Monocytes Absolute: 0.9 K/uL (ref 0.1–1.0)
Monocytes Relative: 9 %
Neutro Abs: 4.9 K/uL (ref 1.7–7.7)
Neutrophils Relative %: 45 %
Platelets: 350 K/uL (ref 150–400)
RBC: 4.28 MIL/uL (ref 3.87–5.11)
RDW: 13.5 % (ref 11.5–15.5)
WBC: 10.7 K/uL — ABNORMAL HIGH (ref 4.0–10.5)
nRBC: 0 % (ref 0.0–0.2)

## 2024-10-02 LAB — COMPREHENSIVE METABOLIC PANEL WITH GFR
ALT: 31 U/L (ref 0–44)
AST: 34 U/L (ref 15–41)
Albumin: 3.8 g/dL (ref 3.5–5.0)
Alkaline Phosphatase: 47 U/L (ref 38–126)
Anion gap: 10 (ref 5–15)
BUN: 8 mg/dL (ref 6–20)
CO2: 21 mmol/L — ABNORMAL LOW (ref 22–32)
Calcium: 9.1 mg/dL (ref 8.9–10.3)
Chloride: 106 mmol/L (ref 98–111)
Creatinine, Ser: 0.86 mg/dL (ref 0.44–1.00)
GFR, Estimated: >60 mL/min (ref >60)
Glucose, Bld: 96 mg/dL (ref 70–99)
Potassium: 3.6 mmol/L (ref 3.5–5.1)
Sodium: 137 mmol/L (ref 135–145)
Total Bilirubin: 0.7 mg/dL (ref 0.0–1.2)
Total Protein: 6.8 g/dL (ref 6.5–8.1)

## 2024-10-02 LAB — I-STAT CHEM 8, ED
BUN: 8 mg/dL (ref 6–20)
Calcium, Ion: 1.16 mmol/L (ref 1.15–1.40)
Chloride: 106 mmol/L (ref 98–111)
Creatinine, Ser: 0.8 mg/dL (ref 0.44–1.00)
Glucose, Bld: 90 mg/dL (ref 70–99)
HCT: 39 % (ref 36.0–46.0)
Hemoglobin: 13.3 g/dL (ref 12.0–15.0)
Potassium: 3.6 mmol/L (ref 3.5–5.1)
Sodium: 141 mmol/L (ref 135–145)
TCO2: 20 mmol/L — ABNORMAL LOW (ref 22–32)

## 2024-10-02 LAB — URINALYSIS, ROUTINE W REFLEX MICROSCOPIC
Bilirubin Urine: NEGATIVE
Glucose, UA: NEGATIVE mg/dL
Ketones, ur: 5 mg/dL — AB
Leukocytes,Ua: NEGATIVE
Nitrite: POSITIVE — AB
Protein, ur: 100 mg/dL — AB
RBC / HPF: >50 RBC/hpf (ref 0–5)
Specific Gravity, Urine: 1.025 (ref 1.005–1.030)
pH: 5 (ref 5.0–8.0)

## 2024-10-02 LAB — I-STAT CG4 LACTIC ACID, ED
Lactic Acid, Venous: 0.8 mmol/L (ref 0.5–1.9)
Lactic Acid, Venous: 0.4 mmol/L — ABNORMAL LOW (ref 0.5–1.9)

## 2024-10-02 LAB — CBC
HCT: 34.4 % — ABNORMAL LOW (ref 36.0–46.0)
Hemoglobin: 11.6 g/dL — ABNORMAL LOW (ref 12.0–15.0)
MCH: 29.5 pg (ref 26.0–34.0)
MCHC: 33.7 g/dL (ref 30.0–36.0)
MCV: 87.5 fL (ref 80.0–100.0)
Platelets: 315 K/uL (ref 150–400)
RBC: 3.93 MIL/uL (ref 3.87–5.11)
RDW: 13.5 % (ref 11.5–15.5)
WBC: 7.7 K/uL (ref 4.0–10.5)
nRBC: 0 % (ref 0.0–0.2)

## 2024-10-02 LAB — HCG, SERUM, QUALITATIVE: Preg, Serum: NEGATIVE

## 2024-10-02 LAB — CREATININE, SERUM
Creatinine, Ser: 0.75 mg/dL (ref 0.44–1.00)
GFR, Estimated: >60 mL/min (ref >60)

## 2024-10-02 LAB — HIV ANTIBODY (ROUTINE TESTING W REFLEX): HIV Screen 4th Generation wRfx: NONREACTIVE

## 2024-10-02 LAB — POC URINE PREG, ED: Preg Test, Ur: NEGATIVE

## 2024-10-02 LAB — PREGNANCY, URINE: Preg Test, Ur: NEGATIVE

## 2024-10-02 MED ORDER — HYDRALAZINE HCL 20 MG/ML IJ SOLN: 10.0000 mg | INTRAMUSCULAR | Status: DC | PRN

## 2024-10-02 MED ORDER — ONDANSETRON HCL 4 MG/2ML IJ SOLN: 4.0000 mg | Freq: Four times a day (QID) | INTRAMUSCULAR | Status: DC | PRN

## 2024-10-02 MED ORDER — POLYETHYLENE GLYCOL 3350 17 G PO PACK: 17.0000 g | PACK | Freq: Every day | ORAL | Status: DC | PRN

## 2024-10-02 MED ORDER — CEPHALEXIN 500 MG PO CAPS
500.0000 mg | ORAL_CAPSULE | Freq: Four times a day (QID) | ORAL | 0 refills | Status: DC
  Filled 2024-10-02: qty 28, 7d supply, fill #0

## 2024-10-02 MED ORDER — METHOCARBAMOL 500 MG PO TABS
500.0000 mg | ORAL_TABLET | Freq: Three times a day (TID) | ORAL | Status: DC
  Administered 2024-10-02 – 2024-10-03 (×3): 500 mg via ORAL
  Filled 2024-10-02 (×3): qty 1

## 2024-10-02 MED ORDER — FENTANYL CITRATE (PF) 50 MCG/ML IJ SOSY
50.0000 ug | PREFILLED_SYRINGE | Freq: Once | INTRAMUSCULAR | Status: AC
  Administered 2024-10-02: 50 ug via INTRAVENOUS
  Filled 2024-10-02: qty 1

## 2024-10-02 MED ORDER — IOHEXOL 350 MG/ML SOLN
75.0000 mL | Freq: Once | INTRAVENOUS | Status: AC | PRN
  Administered 2024-10-02: 75 mL via INTRAVENOUS

## 2024-10-02 MED ORDER — OXYCODONE HCL 5 MG PO TABS
5.0000 mg | ORAL_TABLET | ORAL | Status: DC | PRN
  Administered 2024-10-02: 5 mg via ORAL
  Filled 2024-10-02: qty 1

## 2024-10-02 MED ORDER — CEPHALEXIN 500 MG PO CAPS
500.0000 mg | ORAL_CAPSULE | Freq: Two times a day (BID) | ORAL | 0 refills | Status: DC
  Filled 2024-10-02: qty 14, 7d supply, fill #0

## 2024-10-02 MED ORDER — CEPHALEXIN 250 MG PO CAPS
500.0000 mg | ORAL_CAPSULE | Freq: Once | ORAL | Status: AC
  Administered 2024-10-02: 500 mg via ORAL
  Filled 2024-10-02: qty 2

## 2024-10-02 MED ORDER — ONDANSETRON 4 MG PO TBDP: 4.0000 mg | ORAL_TABLET | Freq: Four times a day (QID) | ORAL | Status: DC | PRN

## 2024-10-02 MED ORDER — METHOCARBAMOL 1000 MG/10ML IJ SOLN: 500.0000 mg | Freq: Three times a day (TID) | INTRAMUSCULAR | Status: DC

## 2024-10-02 MED ORDER — DOCUSATE SODIUM 100 MG PO CAPS
100.0000 mg | ORAL_CAPSULE | Freq: Two times a day (BID) | ORAL | Status: DC
  Administered 2024-10-02 – 2024-10-03 (×3): 100 mg via ORAL
  Filled 2024-10-02 (×3): qty 1

## 2024-10-02 MED ORDER — ACETAMINOPHEN 500 MG PO TABS
1000.0000 mg | ORAL_TABLET | Freq: Four times a day (QID) | ORAL | Status: DC
  Administered 2024-10-03 (×2): 1000 mg via ORAL
  Filled 2024-10-02 (×4): qty 2

## 2024-10-02 MED ORDER — HYDROMORPHONE HCL 1 MG/ML IJ SOLN
1.0000 mg | Freq: Once | INTRAMUSCULAR | Status: AC
  Administered 2024-10-02: 1 mg via INTRAVENOUS
  Filled 2024-10-02: qty 1

## 2024-10-02 MED ORDER — ENOXAPARIN SODIUM 30 MG/0.3ML IJ SOSY
30.0000 mg | PREFILLED_SYRINGE | Freq: Two times a day (BID) | INTRAMUSCULAR | Status: DC
  Administered 2024-10-03: 30 mg via SUBCUTANEOUS
  Filled 2024-10-02: qty 0.3

## 2024-10-02 MED ORDER — SODIUM CHLORIDE 0.9 % IV BOLUS
1000.0000 mL | Freq: Once | INTRAVENOUS | Status: AC
  Administered 2024-10-02: 1000 mL via INTRAVENOUS

## 2024-10-02 MED ORDER — METOPROLOL TARTRATE 5 MG/5ML IV SOLN: 5.0000 mg | Freq: Four times a day (QID) | INTRAVENOUS | Status: DC | PRN

## 2024-10-02 MED ORDER — HYDROMORPHONE HCL 1 MG/ML IJ SOLN
1.0000 mg | INTRAMUSCULAR | Status: DC | PRN
  Administered 2024-10-02 – 2024-10-03 (×3): 1 mg via INTRAVENOUS
  Filled 2024-10-02 (×3): qty 1

## 2024-10-02 NOTE — Discharge Instructions (Addendum)
 Please wear your brace whenever sitting or standing upright. You do not need to wear the brace when bathing, showering, or laying down. Typically we prescribe a brace for a total of 3 months

## 2024-10-02 NOTE — Plan of Care (Signed)
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
## 2024-10-02 NOTE — Progress Notes (Signed)
 Transition of Care [facility]) - CAGE-AID Screening   Patient Details  Name: Betty Davis MRN: [id number] Date of Birth: 25-Apr-2006   [name], RN Trauma Response Nurse Phone Number: [phone] 10/02/2024, 6:38 PM    CAGE-AID Screening:    Have You Ever Felt You Ought to Cut Down on Your Drinking or Drug Use?: No Have People Annoyed You By Critizing Your Drinking Or Drug Use?: No Have You Felt Bad Or Guilty About Your Drinking Or Drug Use?: No Have You Ever Had a Drink or Used Drugs First Thing In The Morning to Steady Your Nerves or to Get Rid of a Hangover?: No CAGE-AID Score: 0  Substance Abuse Education Offered: (S) No (No services needed, pt does not drink/use illicit drugs)

## 2024-10-02 NOTE — ED Notes (Addendum)
 Trauma Event Note    Dr. Darnella at bedside 0945  MRI ordered. OK to logroll pt, HOB at 30,  Last imported Vital Signs BP 124/68   Pulse 79   Temp 98.6 F (37 C) (Oral)   Resp 13   Ht 5' 4 (1.626 m)   Wt 119 lb 0.8 oz (54 kg)   SpO2 100%   BMI 20.43 kg/m   Trending CBC Recent Labs    10/02/24 0453 10/02/24 0501  WBC 10.7*  --   HGB 12.4 13.3  HCT 37.6 39.0  PLT 350  --     Trending Coag's No results for input(s): APTT, INR in the last 72 hours.  Trending BMET Recent Labs    10/02/24 0453 10/02/24 0501  NA 137 141  K 3.6 3.6  CL 106 106  CO2 21*  --   BUN 8 8  CREATININE 0.86 0.80  GLUCOSE 96 90      Betty Davis  Trauma Response RN  Please call TRN at [phone] for further assistance.
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
## 2024-10-02 NOTE — Progress Notes (Signed)
 Orthopedic Tech Progress Note Patient Details:  Betty Davis 07/03/2006 [id number]  LSO adjusted for pt. Pt and mother at bedside express understanding on removal and application.  Ortho Devices Type of Ortho Device: Lumbar corsett Ortho Device/Splint Location: adjusted to pt, now at bedside as she's in bed Ortho Device/Splint Interventions: Ordered, Adjustment   Post Interventions Patient Tolerated: Fair Instructions Provided: Care of device, Adjustment of device  Donnald Tabar Ronal Brasil 10/02/2024, 3:34 PM
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
## 2024-10-02 NOTE — Progress Notes (Signed)
 MRI reviewed - no neurologic compression  Plan: LSO brace ordered Obtain upright lumbar x-rays in brace after it arrives If alignment stable, ok to discharge with brace with plans for 1 month follow-up Bedrest, head of bed < 30 degrees in meantime Diet as tolerated Ok for DVT ppx

## 2024-10-03 ENCOUNTER — Other Ambulatory Visit (HOSPITAL_COMMUNITY): Payer: Self-pay

## 2024-10-03 LAB — BASIC METABOLIC PANEL WITH GFR
Anion gap: 9 (ref 5–15)
BUN: 7 mg/dL (ref 6–20)
CO2: 22 mmol/L (ref 22–32)
Calcium: 8.7 mg/dL — ABNORMAL LOW (ref 8.9–10.3)
Chloride: 107 mmol/L (ref 98–111)
Creatinine, Ser: 0.74 mg/dL (ref 0.44–1.00)
GFR, Estimated: >60 mL/min (ref >60)
Glucose, Bld: 95 mg/dL (ref 70–99)
Potassium: 3.7 mmol/L (ref 3.5–5.1)
Sodium: 138 mmol/L (ref 135–145)

## 2024-10-03 LAB — CBC
HCT: 33.7 % — ABNORMAL LOW (ref 36.0–46.0)
Hemoglobin: 11 g/dL — ABNORMAL LOW (ref 12.0–15.0)
MCH: 28.8 pg (ref 26.0–34.0)
MCHC: 32.6 g/dL (ref 30.0–36.0)
MCV: 88.2 fL (ref 80.0–100.0)
Platelets: 293 K/uL (ref 150–400)
RBC: 3.82 MIL/uL — ABNORMAL LOW (ref 3.87–5.11)
RDW: 13.7 % (ref 11.5–15.5)
WBC: 8 K/uL (ref 4.0–10.5)
nRBC: 0 % (ref 0.0–0.2)

## 2024-10-03 MED ORDER — METHOCARBAMOL 500 MG PO TABS
1000.0000 mg | ORAL_TABLET | Freq: Three times a day (TID) | ORAL | 0 refills | Status: DC | PRN
  Filled 2024-10-03: qty 60, 10d supply, fill #0

## 2024-10-03 MED ORDER — HYDROMORPHONE HCL 1 MG/ML IJ SOLN: 0.5000 mg | INTRAMUSCULAR | Status: DC | PRN

## 2024-10-03 MED ORDER — POLYETHYLENE GLYCOL 3350 17 G PO PACK: 17.0000 g | PACK | Freq: Every day | ORAL | Status: DC | PRN

## 2024-10-03 MED ORDER — OXYCODONE HCL 5 MG PO TABS
5.0000 mg | ORAL_TABLET | ORAL | 0 refills | Status: DC | PRN
  Filled 2024-10-03: qty 30, 3d supply, fill #0

## 2024-10-03 MED ORDER — LIDOCAINE 5 % EX PTCH
1.0000 | MEDICATED_PATCH | CUTANEOUS | 0 refills | Status: DC
  Filled 2024-10-03: qty 30, 30d supply, fill #0

## 2024-10-03 MED ORDER — DOCUSATE SODIUM 100 MG PO CAPS: 100.0000 mg | ORAL_CAPSULE | Freq: Two times a day (BID) | ORAL | Status: DC | PRN

## 2024-10-03 MED ORDER — OXYCODONE HCL 5 MG PO TABS
5.0000 mg | ORAL_TABLET | ORAL | Status: DC | PRN
  Administered 2024-10-03: 10 mg via ORAL
  Filled 2024-10-03 (×2): qty 1

## 2024-10-03 MED ORDER — METHOCARBAMOL 500 MG PO TABS: 1000.0000 mg | ORAL_TABLET | Freq: Three times a day (TID) | ORAL | Status: DC

## 2024-10-03 MED ORDER — LIDOCAINE 5 % EX PTCH
1.0000 | MEDICATED_PATCH | CUTANEOUS | Status: DC
  Administered 2024-10-03: 1 via TRANSDERMAL
  Filled 2024-10-03: qty 1

## 2024-10-03 MED ORDER — CEPHALEXIN 500 MG PO CAPS
500.0000 mg | ORAL_CAPSULE | Freq: Two times a day (BID) | ORAL | 0 refills | Status: DC
  Filled 2024-10-03: qty 14, 7d supply, fill #0

## 2024-10-03 MED ORDER — ACETAMINOPHEN 500 MG PO TABS: 1000.0000 mg | ORAL_TABLET | Freq: Four times a day (QID) | ORAL | Status: AC | PRN
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
## 2024-10-03 NOTE — Plan of Care (Signed)
  Problem: Education: Goal: Knowledge of General Education information will improve Description: Including pain rating scale, medication(s)/side effects and non-pharmacologic comfort measures Outcome: Not Progressing   Problem: Health Behavior/Discharge Planning: Goal: Ability to manage health-related needs will improve Outcome: Not Progressing   Problem: Clinical Measurements: Goal: Ability to maintain clinical measurements within normal limits will improve Outcome: Not Progressing Goal: Will remain free from infection Outcome: Not Progressing Goal: Diagnostic test results will improve Outcome: Not Progressing Goal: Respiratory complications will improve Outcome: Not Progressing Goal: Cardiovascular complication will be avoided Outcome: Not Progressing   Problem: Coping: Goal: Level of anxiety will decrease Outcome: Not Progressing   Problem: Elimination: Goal: Will not experience complications related to bowel motility Outcome: Not Progressing Goal: Will not experience complications related to urinary retention Outcome: Not Progressing   

## 2024-10-04 ENCOUNTER — Other Ambulatory Visit (HOSPITAL_COMMUNITY): Payer: Self-pay

## 2024-10-04 ENCOUNTER — Other Ambulatory Visit: Admitting: Psychology
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
## 2024-10-05 ENCOUNTER — Other Ambulatory Visit: Payer: Self-pay

## 2024-10-05 ENCOUNTER — Encounter: Payer: Self-pay | Admitting: Physical Therapy

## 2024-10-05 ENCOUNTER — Ambulatory Visit: Payer: PRIVATE HEALTH INSURANCE | Attending: Physician Assistant | Admitting: Physical Therapy

## 2024-10-05 ENCOUNTER — Other Ambulatory Visit (HOSPITAL_COMMUNITY): Payer: Self-pay

## 2024-10-05 DIAGNOSIS — S32009A Unspecified fracture of unspecified lumbar vertebra, initial encounter for closed fracture: Secondary | ICD-10-CM | POA: Insufficient documentation

## 2024-10-05 DIAGNOSIS — R2689 Other abnormalities of gait and mobility: Secondary | ICD-10-CM | POA: Insufficient documentation

## 2024-10-05 DIAGNOSIS — M79604 Pain in right leg: Secondary | ICD-10-CM | POA: Diagnosis present

## 2024-10-05 DIAGNOSIS — M5459 Other low back pain: Secondary | ICD-10-CM | POA: Diagnosis present

## 2024-10-05 DIAGNOSIS — R252 Cramp and spasm: Secondary | ICD-10-CM | POA: Diagnosis present

## 2024-10-05 DIAGNOSIS — M79605 Pain in left leg: Secondary | ICD-10-CM | POA: Insufficient documentation

## 2024-10-06 ENCOUNTER — Ambulatory Visit: Payer: PRIVATE HEALTH INSURANCE

## 2024-10-07 ENCOUNTER — Ambulatory Visit: Payer: PRIVATE HEALTH INSURANCE | Admitting: Rehabilitative and Restorative Service Providers"

## 2024-10-07 ENCOUNTER — Encounter: Payer: Self-pay | Admitting: Rehabilitative and Restorative Service Providers"

## 2024-10-07 DIAGNOSIS — M5459 Other low back pain: Secondary | ICD-10-CM | POA: Diagnosis not present

## 2024-10-07 DIAGNOSIS — M79605 Pain in left leg: Secondary | ICD-10-CM

## 2024-10-07 DIAGNOSIS — R2689 Other abnormalities of gait and mobility: Secondary | ICD-10-CM

## 2024-10-07 DIAGNOSIS — R252 Cramp and spasm: Secondary | ICD-10-CM

## 2024-10-07 DIAGNOSIS — M79604 Pain in right leg: Secondary | ICD-10-CM
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
## 2024-10-09 ENCOUNTER — Emergency Department (HOSPITAL_COMMUNITY)

## 2024-10-09 ENCOUNTER — Encounter (HOSPITAL_COMMUNITY): Payer: Self-pay

## 2024-10-09 ENCOUNTER — Other Ambulatory Visit: Payer: Self-pay

## 2024-10-09 ENCOUNTER — Emergency Department (HOSPITAL_COMMUNITY)
Admission: EM | Admit: 2024-10-09 | Discharge: 2024-10-09 | Disposition: A | Discharge status: Home / Self Care | Attending: Emergency Medicine | Admitting: Emergency Medicine

## 2024-10-09 DIAGNOSIS — S32050D Wedge compression fracture of fifth lumbar vertebra, subsequent encounter for fracture with routine healing: Secondary | ICD-10-CM

## 2024-10-09 DIAGNOSIS — N898 Other specified noninflammatory disorders of vagina: Secondary | ICD-10-CM | POA: Insufficient documentation

## 2024-10-09 DIAGNOSIS — S32048D Other fracture of fourth lumbar vertebra, subsequent encounter for fracture with routine healing: Secondary | ICD-10-CM | POA: Insufficient documentation

## 2024-10-09 DIAGNOSIS — K5900 Constipation, unspecified: Secondary | ICD-10-CM | POA: Insufficient documentation

## 2024-10-09 DIAGNOSIS — S32058D Other fracture of fifth lumbar vertebra, subsequent encounter for fracture with routine healing: Secondary | ICD-10-CM | POA: Insufficient documentation

## 2024-10-09 DIAGNOSIS — M4856XA Collapsed vertebra, not elsewhere classified, lumbar region, initial encounter for fracture: Secondary | ICD-10-CM | POA: Diagnosis not present

## 2024-10-09 DIAGNOSIS — M549 Dorsalgia, unspecified: Secondary | ICD-10-CM | POA: Diagnosis present

## 2024-10-09 DIAGNOSIS — S32040D Wedge compression fracture of fourth lumbar vertebra, subsequent encounter for fracture with routine healing: Secondary | ICD-10-CM

## 2024-10-09 LAB — BASIC METABOLIC PANEL WITH GFR
Anion gap: 10 (ref 5–15)
BUN: 8 mg/dL (ref 6–20)
CO2: 22 mmol/L (ref 22–32)
Calcium: 9.1 mg/dL (ref 8.9–10.3)
Chloride: 106 mmol/L (ref 98–111)
Creatinine, Ser: 0.63 mg/dL (ref 0.44–1.00)
GFR, Estimated: >60 mL/min (ref >60)
Glucose, Bld: 98 mg/dL (ref 70–99)
Potassium: 4 mmol/L (ref 3.5–5.1)
Sodium: 138 mmol/L (ref 135–145)

## 2024-10-09 LAB — CBC WITH DIFFERENTIAL/PLATELET
Abs Immature Granulocytes: 0.03 K/uL (ref 0.00–0.07)
Basophils Absolute: 0.1 K/uL (ref 0.0–0.1)
Basophils Relative: 1 %
Eosinophils Absolute: 0.1 K/uL (ref 0.0–0.5)
Eosinophils Relative: 1 %
HCT: 40.7 % (ref 36.0–46.0)
Hemoglobin: 12.5 g/dL (ref 12.0–15.0)
Immature Granulocytes: 0 %
Lymphocytes Relative: 33 %
Lymphs Abs: 2.6 K/uL (ref 0.7–4.0)
MCH: 28.9 pg (ref 26.0–34.0)
MCHC: 30.7 g/dL (ref 30.0–36.0)
MCV: 94.2 fL (ref 80.0–100.0)
Monocytes Absolute: 0.9 K/uL (ref 0.1–1.0)
Monocytes Relative: 12 %
Neutro Abs: 4.2 K/uL (ref 1.7–7.7)
Neutrophils Relative %: 53 %
Platelets: 317 K/uL (ref 150–400)
RBC: 4.32 MIL/uL (ref 3.87–5.11)
RDW: 12.9 % (ref 11.5–15.5)
WBC: 7.9 K/uL (ref 4.0–10.5)
nRBC: 0 % (ref 0.0–0.2)

## 2024-10-09 MED ORDER — HYDROMORPHONE HCL 1 MG/ML IJ SOLN
1.0000 mg | Freq: Once | INTRAMUSCULAR | Status: AC
  Administered 2024-10-09: 1 mg via INTRAMUSCULAR
  Filled 2024-10-09: qty 1

## 2024-10-09 MED ORDER — FLUCONAZOLE 150 MG PO TABS
150.0000 mg | ORAL_TABLET | Freq: Once | ORAL | Status: AC
  Administered 2024-10-09: 150 mg via ORAL
  Filled 2024-10-09: qty 1
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
## 2024-10-09 NOTE — Discharge Instructions (Addendum)
 Continue home medicine.  Try taking a dose of miralax today and tomorrow to help with constipation.

## 2024-10-09 NOTE — ED Notes (Signed)
 Pt and mother verbalized understanding of discharge instructions. Pt assisted from ED.
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
## 2024-10-11 ENCOUNTER — Ambulatory Visit: Payer: Self-pay | Admitting: Rehabilitative and Restorative Service Providers"

## 2024-10-14 ENCOUNTER — Ambulatory Visit: Payer: PRIVATE HEALTH INSURANCE

## 2024-10-14 DIAGNOSIS — M79605 Pain in left leg: Secondary | ICD-10-CM

## 2024-10-14 DIAGNOSIS — M5459 Other low back pain: Secondary | ICD-10-CM | POA: Diagnosis not present

## 2024-10-14 DIAGNOSIS — M79604 Pain in right leg: Secondary | ICD-10-CM

## 2024-10-14 DIAGNOSIS — R252 Cramp and spasm: Secondary | ICD-10-CM

## 2024-10-14 DIAGNOSIS — R2689 Other abnormalities of gait and mobility: Secondary | ICD-10-CM
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
## 2024-10-17 ENCOUNTER — Ambulatory Visit: Admitting: Psychology

## 2024-10-18 ENCOUNTER — Ambulatory Visit: Payer: PRIVATE HEALTH INSURANCE | Admitting: Physical Therapy

## 2024-10-18 ENCOUNTER — Encounter: Payer: Self-pay | Admitting: Physical Therapy

## 2024-10-18 DIAGNOSIS — M79604 Pain in right leg: Secondary | ICD-10-CM

## 2024-10-18 DIAGNOSIS — M5459 Other low back pain: Secondary | ICD-10-CM | POA: Diagnosis not present

## 2024-10-18 DIAGNOSIS — M79605 Pain in left leg: Secondary | ICD-10-CM

## 2024-10-18 DIAGNOSIS — R252 Cramp and spasm: Secondary | ICD-10-CM

## 2024-10-18 DIAGNOSIS — R2689 Other abnormalities of gait and mobility: Secondary | ICD-10-CM
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
## 2024-10-21 ENCOUNTER — Ambulatory Visit: Payer: PRIVATE HEALTH INSURANCE | Admitting: Physical Therapy

## 2024-10-24 ENCOUNTER — Ambulatory Visit: Payer: PRIVATE HEALTH INSURANCE

## 2024-10-28 ENCOUNTER — Other Ambulatory Visit: Payer: Self-pay

## 2024-10-28 ENCOUNTER — Ambulatory Visit: Payer: PRIVATE HEALTH INSURANCE | Admitting: Physical Therapy

## 2024-11-01 ENCOUNTER — Ambulatory Visit: Payer: PRIVATE HEALTH INSURANCE

## 2024-11-01 ENCOUNTER — Other Ambulatory Visit (HOSPITAL_COMMUNITY): Payer: Self-pay

## 2024-11-04 ENCOUNTER — Ambulatory Visit: Payer: PRIVATE HEALTH INSURANCE | Admitting: Physical Therapy

## 2024-11-08 ENCOUNTER — Other Ambulatory Visit (HOSPITAL_COMMUNITY): Payer: Self-pay

## 2024-11-08 ENCOUNTER — Ambulatory Visit: Payer: PRIVATE HEALTH INSURANCE

## 2024-11-08 DIAGNOSIS — F902 Attention-deficit hyperactivity disorder, combined type: Secondary | ICD-10-CM | POA: Diagnosis not present

## 2024-11-08 DIAGNOSIS — F411 Generalized anxiety disorder: Secondary | ICD-10-CM | POA: Diagnosis not present

## 2024-11-08 DIAGNOSIS — F605 Obsessive-compulsive personality disorder: Secondary | ICD-10-CM | POA: Diagnosis not present

## 2024-11-08 DIAGNOSIS — F331 Major depressive disorder, recurrent, moderate: Secondary | ICD-10-CM | POA: Diagnosis not present

## 2024-11-08 MED ORDER — ESCITALOPRAM OXALATE 10 MG PO TABS
10.0000 mg | ORAL_TABLET | Freq: Every day | ORAL | 1 refills | Status: DC
  Filled 2024-11-08 – 2024-11-26 (×2): qty 30, 30d supply, fill #0
  Filled 2024-12-20: qty 30, 30d supply, fill #1

## 2024-11-08 MED ORDER — HYDROXYZINE HCL 25 MG PO TABS
25.0000 mg | ORAL_TABLET | Freq: Two times a day (BID) | ORAL | 3 refills | Status: DC
  Filled 2024-11-08: qty 60, 30d supply, fill #0

## 2024-11-08 MED ORDER — PRAZOSIN HCL 1 MG PO CAPS
1.0000 mg | ORAL_CAPSULE | Freq: Every evening | ORAL | 0 refills | Status: AC
  Filled 2024-11-08: qty 30, 30d supply, fill #0

## 2024-11-08 MED ORDER — ATOMOXETINE HCL 40 MG PO CAPS
40.0000 mg | ORAL_CAPSULE | Freq: Every day | ORAL | 4 refills | Status: DC
  Filled 2024-11-08: qty 30, 30d supply, fill #0

## 2024-11-09 ENCOUNTER — Other Ambulatory Visit (HOSPITAL_COMMUNITY): Payer: Self-pay

## 2024-11-09 ENCOUNTER — Other Ambulatory Visit: Payer: Self-pay

## 2024-11-10 ENCOUNTER — Other Ambulatory Visit (HOSPITAL_COMMUNITY): Payer: Self-pay

## 2024-11-10 DIAGNOSIS — M5459 Other low back pain: Secondary | ICD-10-CM | POA: Diagnosis not present

## 2024-11-11 ENCOUNTER — Ambulatory Visit: Payer: PRIVATE HEALTH INSURANCE | Admitting: Rehabilitative and Restorative Service Providers"

## 2024-11-11 DIAGNOSIS — M6281 Muscle weakness (generalized): Secondary | ICD-10-CM | POA: Diagnosis not present

## 2024-11-11 DIAGNOSIS — M545 Low back pain, unspecified: Secondary | ICD-10-CM | POA: Diagnosis not present

## 2024-11-11 DIAGNOSIS — M532X6 Spinal instabilities, lumbar region: Secondary | ICD-10-CM | POA: Diagnosis not present

## 2024-11-15 ENCOUNTER — Other Ambulatory Visit: Payer: Self-pay

## 2024-11-15 ENCOUNTER — Emergency Department (HOSPITAL_COMMUNITY)
Admission: EM | Admit: 2024-11-15 | Discharge: 2024-11-15 | Disposition: A | Discharge status: Home / Self Care | Attending: Emergency Medicine | Admitting: Emergency Medicine

## 2024-11-15 ENCOUNTER — Emergency Department (HOSPITAL_COMMUNITY)

## 2024-11-15 ENCOUNTER — Ambulatory Visit: Payer: PRIVATE HEALTH INSURANCE

## 2024-11-15 DIAGNOSIS — W182XXA Fall in (into) shower or empty bathtub, initial encounter: Secondary | ICD-10-CM | POA: Diagnosis not present

## 2024-11-15 DIAGNOSIS — Y92002 Bathroom of unspecified non-institutional (private) residence single-family (private) house as the place of occurrence of the external cause: Secondary | ICD-10-CM | POA: Insufficient documentation

## 2024-11-15 DIAGNOSIS — S32059A Unspecified fracture of fifth lumbar vertebra, initial encounter for closed fracture: Secondary | ICD-10-CM | POA: Diagnosis not present

## 2024-11-15 DIAGNOSIS — Y93E1 Activity, personal bathing and showering: Secondary | ICD-10-CM | POA: Diagnosis not present

## 2024-11-15 DIAGNOSIS — W19XXXA Unspecified fall, initial encounter: Secondary | ICD-10-CM

## 2024-11-15 DIAGNOSIS — S32059S Unspecified fracture of fifth lumbar vertebra, sequela: Secondary | ICD-10-CM

## 2024-11-15 DIAGNOSIS — S32049A Unspecified fracture of fourth lumbar vertebra, initial encounter for closed fracture: Secondary | ICD-10-CM | POA: Insufficient documentation

## 2024-11-15 DIAGNOSIS — M549 Dorsalgia, unspecified: Secondary | ICD-10-CM | POA: Diagnosis present

## 2024-11-15 DIAGNOSIS — M4856XA Collapsed vertebra, not elsewhere classified, lumbar region, initial encounter for fracture: Secondary | ICD-10-CM | POA: Diagnosis not present

## 2024-11-15 DIAGNOSIS — Z043 Encounter for examination and observation following other accident: Secondary | ICD-10-CM | POA: Diagnosis not present

## 2024-11-15 DIAGNOSIS — M4316 Spondylolisthesis, lumbar region: Secondary | ICD-10-CM | POA: Diagnosis not present

## 2024-11-15 DIAGNOSIS — S32049S Unspecified fracture of fourth lumbar vertebra, sequela: Secondary | ICD-10-CM

## 2024-11-15 DIAGNOSIS — M47816 Spondylosis without myelopathy or radiculopathy, lumbar region: Secondary | ICD-10-CM | POA: Diagnosis not present
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
## 2024-11-15 NOTE — Discharge Instructions (Signed)
 Your x-rays today are reassuring, no new injuries. Wear your brace and follow up with your care team.

## 2024-11-22 ENCOUNTER — Ambulatory Visit: Payer: PRIVATE HEALTH INSURANCE | Admitting: Physical Therapy

## 2024-11-25 ENCOUNTER — Ambulatory Visit: Payer: PRIVATE HEALTH INSURANCE | Admitting: Physical Therapy

## 2024-11-25 DIAGNOSIS — M532X6 Spinal instabilities, lumbar region: Secondary | ICD-10-CM | POA: Diagnosis not present

## 2024-11-25 DIAGNOSIS — M6281 Muscle weakness (generalized): Secondary | ICD-10-CM | POA: Diagnosis not present

## 2024-11-25 DIAGNOSIS — M545 Low back pain, unspecified: Secondary | ICD-10-CM | POA: Diagnosis not present

## 2024-11-26 ENCOUNTER — Other Ambulatory Visit (HOSPITAL_COMMUNITY): Payer: Self-pay

## 2024-11-29 ENCOUNTER — Ambulatory Visit: Payer: PRIVATE HEALTH INSURANCE | Admitting: Physical Therapy

## 2024-12-01 ENCOUNTER — Emergency Department (HOSPITAL_BASED_OUTPATIENT_CLINIC_OR_DEPARTMENT_OTHER)
Admission: EM | Admit: 2024-12-01 | Discharge: 2024-12-01 | Disposition: A | Discharge status: Home / Self Care | Attending: Emergency Medicine | Admitting: Emergency Medicine

## 2024-12-01 ENCOUNTER — Encounter (HOSPITAL_COMMUNITY): Payer: Self-pay

## 2024-12-01 ENCOUNTER — Inpatient Hospital Stay (HOSPITAL_COMMUNITY)
Admission: AD | Admit: 2024-12-01 | Discharge: 2024-12-02 | DRG: 885 | Disposition: A | Discharge status: Home / Self Care | Source: Intra-hospital

## 2024-12-01 ENCOUNTER — Encounter (HOSPITAL_COMMUNITY): Payer: Self-pay | Admitting: Psychiatry

## 2024-12-01 ENCOUNTER — Other Ambulatory Visit: Payer: Self-pay

## 2024-12-01 ENCOUNTER — Emergency Department (HOSPITAL_COMMUNITY)
Admission: EM | Admit: 2024-12-01 | Discharge: 2024-12-01 | Disposition: A | Discharge status: Other Acute Inpatient Hospital | Attending: Emergency Medicine | Admitting: Emergency Medicine

## 2024-12-01 ENCOUNTER — Encounter (HOSPITAL_BASED_OUTPATIENT_CLINIC_OR_DEPARTMENT_OTHER): Payer: Self-pay

## 2024-12-01 DIAGNOSIS — F4310 Post-traumatic stress disorder, unspecified: Secondary | ICD-10-CM | POA: Diagnosis not present

## 2024-12-01 DIAGNOSIS — Z79899 Other long term (current) drug therapy: Secondary | ICD-10-CM | POA: Diagnosis not present

## 2024-12-01 DIAGNOSIS — F909 Attention-deficit hyperactivity disorder, unspecified type: Secondary | ICD-10-CM | POA: Diagnosis not present

## 2024-12-01 DIAGNOSIS — F332 Major depressive disorder, recurrent severe without psychotic features: Principal | ICD-10-CM | POA: Diagnosis present

## 2024-12-01 DIAGNOSIS — F99 Mental disorder, not otherwise specified: Secondary | ICD-10-CM | POA: Diagnosis not present

## 2024-12-01 DIAGNOSIS — R45851 Suicidal ideations: Secondary | ICD-10-CM

## 2024-12-01 DIAGNOSIS — T7804XA Anaphylactic reaction due to fruits and vegetables, initial encounter: Secondary | ICD-10-CM | POA: Insufficient documentation

## 2024-12-01 DIAGNOSIS — T7840XA Allergy, unspecified, initial encounter: Secondary | ICD-10-CM

## 2024-12-01 DIAGNOSIS — F329 Major depressive disorder, single episode, unspecified: Secondary | ICD-10-CM | POA: Diagnosis not present

## 2024-12-01 LAB — CBC
HCT: 39.2 % (ref 36.0–46.0)
Hemoglobin: 13 g/dL (ref 12.0–15.0)
MCH: 29.2 pg (ref 26.0–34.0)
MCHC: 33.2 g/dL (ref 30.0–36.0)
MCV: 88.1 fL (ref 80.0–100.0)
Platelets: 336 K/uL (ref 150–400)
RBC: 4.45 MIL/uL (ref 3.87–5.11)
RDW: 12.6 % (ref 11.5–15.5)
WBC: 11.4 K/uL — ABNORMAL HIGH (ref 4.0–10.5)
nRBC: 0 % (ref 0.0–0.2)

## 2024-12-01 LAB — RAPID URINE DRUG SCREEN, HOSP PERFORMED
Amphetamines: NOT DETECTED
Barbiturates: NOT DETECTED
Benzodiazepines: NOT DETECTED
Cocaine: NOT DETECTED
Opiates: NOT DETECTED
Tetrahydrocannabinol: NOT DETECTED

## 2024-12-01 LAB — COMPREHENSIVE METABOLIC PANEL WITH GFR
ALT: 26 U/L (ref 0–44)
AST: 31 U/L (ref 15–41)
Albumin: 3.9 g/dL (ref 3.5–5.0)
Alkaline Phosphatase: 63 U/L (ref 38–126)
Anion gap: 7 (ref 5–15)
BUN: 9 mg/dL (ref 6–20)
CO2: 23 mmol/L (ref 22–32)
Calcium: 9 mg/dL (ref 8.9–10.3)
Chloride: 104 mmol/L (ref 98–111)
Creatinine, Ser: 0.69 mg/dL (ref 0.44–1.00)
GFR, Estimated: >60 mL/min (ref >60)
Glucose, Bld: 94 mg/dL (ref 70–99)
Potassium: 3.7 mmol/L (ref 3.5–5.1)
Sodium: 134 mmol/L — ABNORMAL LOW (ref 135–145)
Total Bilirubin: 0.7 mg/dL (ref 0.0–1.2)
Total Protein: 7.2 g/dL (ref 6.5–8.1)

## 2024-12-01 LAB — HCG, SERUM, QUALITATIVE: Preg, Serum: NEGATIVE

## 2024-12-01 LAB — ETHANOL: Alcohol, Ethyl (B): <15 mg/dL (ref <15)

## 2024-12-01 MED ORDER — ACETAMINOPHEN 325 MG PO TABS
650.0000 mg | ORAL_TABLET | Freq: Four times a day (QID) | ORAL | Status: DC | PRN
  Administered 2024-12-02: 650 mg via ORAL
  Filled 2024-12-01: qty 2

## 2024-12-01 MED ORDER — HALOPERIDOL LACTATE 5 MG/ML IJ SOLN: 5.0000 mg | Freq: Three times a day (TID) | INTRAMUSCULAR | Status: DC | PRN

## 2024-12-01 MED ORDER — NORETHIN-ETH ESTRADIOL-FE 0.4-35 MG-MCG PO CHEW
1.0000 | CHEWABLE_TABLET | Freq: Every day | ORAL | Status: DC
  Administered 2024-12-01: 1 via ORAL

## 2024-12-01 MED ORDER — ALUM & MAG HYDROXIDE-SIMETH 200-200-20 MG/5ML PO SUSP: 30.0000 mL | ORAL | Status: DC | PRN

## 2024-12-01 MED ORDER — NORETHIN-ETH ESTRADIOL-FE 0.4-35 MG-MCG PO CHEW
1.0000 | CHEWABLE_TABLET | Freq: Every day | ORAL | Status: DC
  Administered 2024-12-02: 1 via ORAL

## 2024-12-01 MED ORDER — LORAZEPAM 2 MG/ML IJ SOLN: 2.0000 mg | Freq: Three times a day (TID) | INTRAMUSCULAR | Status: DC | PRN

## 2024-12-01 MED ORDER — DIPHENHYDRAMINE HCL 50 MG/ML IJ SOLN: 50.0000 mg | Freq: Three times a day (TID) | INTRAMUSCULAR | Status: DC | PRN

## 2024-12-01 MED ORDER — HALOPERIDOL 5 MG PO TABS: 5.0000 mg | ORAL_TABLET | Freq: Three times a day (TID) | ORAL | Status: DC | PRN

## 2024-12-01 MED ORDER — HALOPERIDOL LACTATE 5 MG/ML IJ SOLN: 10.0000 mg | Freq: Three times a day (TID) | INTRAMUSCULAR | Status: DC | PRN

## 2024-12-01 MED ORDER — DIPHENHYDRAMINE HCL 25 MG PO CAPS: 50.0000 mg | ORAL_CAPSULE | Freq: Three times a day (TID) | ORAL | Status: DC | PRN

## 2024-12-01 MED ORDER — MAGNESIUM HYDROXIDE 400 MG/5ML PO SUSP: 30.0000 mL | Freq: Every day | ORAL | Status: DC | PRN

## 2024-12-01 MED ORDER — DIPHENHYDRAMINE HCL 50 MG/ML IJ SOLN
INTRAMUSCULAR | Status: AC
  Administered 2024-12-01: 50 mg
  Filled 2024-12-01: qty 1

## 2024-12-01 NOTE — ED Triage Notes (Signed)
 Patient BIB PD due to suicidal ideation. Patient voluntarily called PD stating that she was having thought of harming herself. States plan is to cut herself. Reports multiple previous thoughts of harm. Was initially taken to [facility] by PD, but patient requested to come here. A&Ox4.
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
## 2024-12-01 NOTE — Plan of Care (Signed)
   Problem: Education: Goal: Knowledge of [facility] Education information/materials will improve Outcome: Progressing Goal: Emotional status will improve Outcome: Progressing Goal: Mental status will improve Outcome: Progressing

## 2024-12-01 NOTE — ED Provider Triage Note (Signed)
 Emergency Medicine Provider Triage Evaluation Note  Betty Davis , a 18 y.o. female  was evaluated in triage.  Pt complains of SI.  Reports this is chronic.  She takes prescription medications for mental health; no overdose; no illicit drug use in past 24 hours reported.  No active plan.  Pt here in college at university, lives in dorm, states she is estranged from family (we dont really talk).  Requesting psych eval  Review of Systems   Negative: CP, Dizzy  Physical Exam  BP 115/70   Pulse 88   Temp 98 F (36.7 C) (Oral)   Resp 14   Ht 5' 4 (1.626 m)   Wt 52.2 kg   LMP 11/28/2024 (Exact Date) Comment: waiver signed  SpO2 100%   BMI 19.74 kg/m  Gen:   Awake, no distress   Resp:  Normal effort  MSK:   Moves extremities without difficulty  Other:  Mood appropriate, speech even and normal  Medical Decision Making  Medically screening exam initiated at 8:23 AM.  Appropriate orders placed.  Betty Davis was informed that the remainder of the evaluation will be completed by another provider, this initial triage assessment does not replace that evaluation, and the importance of remaining in the ED until their evaluation is complete.  Psych evaluation No medical complaints Chronic SI acutely worsening today, but no specific reason given Patient is here voluntarily and can remain that way pending psych evaluation   Betty Davis, Donnice PARAS, MD 12/01/24 [phone]

## 2024-12-01 NOTE — Progress Notes (Signed)
 Shanda BIRCH Calvina Liptak, RN, 12/01/2024, Time of arrival: 1635  Patient is a new admit to unit. Patient is voluntary. Patient belongings addressed and stored. Skin check performed with two staff (Mia, MHT), results unremarkable. Vital signs unremarkable. No reported or observed physiological concerns or abnormalities. Patient engaged with assessment with encouragement. Patient orientated to facility, unit and room. All questions and concerns addressed at this time.  Patient stated reason for admission/reason for being here: I don't know why I am here. I went to the ED for an allergic reaction and told them I am anxious and they told me to come here. I am confused why I am here but I need to go. I have a train ticket to go home to [place] tomorrow morning.  Patient current presentation remarkable for: Pt is upset about being here, reporting she does not need to be here. Pt preoccupied with going home from college tomorrow, Denies SI/HI/AVH. Cooperative with admission process.  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 Per ED report: Patient voluntarily called PD stating that she was having thoughts of harming herself. States plan is to cut herself. Reports multiple previous thoughts of harm. On admission pt denies ever feeling suicidal and denies calling PD telling them that she is. Pt reports she needs to be discharged ASAP so she can get home from school.

## 2024-12-01 NOTE — Plan of Care (Signed)
   Problem: Education: Goal: Knowledge of [facility] Education information/materials will improve Outcome: Progressing Goal: Verbalization of understanding the information provided will improve Outcome: Progressing

## 2024-12-01 NOTE — ED Notes (Signed)
 Patient dressed out and belongings placed in locker #1.

## 2024-12-01 NOTE — [facility] Group Notes (Signed)
 [facility] Group Notes:  (Nursing/MHT/Case Management/Adjunct)  Date:  12/01/2024  Time:  9:05 PM  Type of Therapy:  Wrap up group  Participation Level:  Active  Participation Quality:  Appropriate  Affect:  Appropriate  Cognitive:  Appropriate  Insight:  Appropriate  Engagement in Group:  Engaged  Modes of Intervention:  Education  Summary of Progress/Problems:Pt states no goal, admitted today. Rated day 3/10.  Betty Davis 12/01/2024, 9:05 PM

## 2024-12-01 NOTE — Group Note (Signed)
 Date:  12/01/2024 Time:  9:16 PM  Group Topic/Focus:  Self Care:   The focus of this group is to help patients understand the importance of self-care in order to improve or restore emotional, physical, spiritual, interpersonal, and financial health.    Pt did not attend group.  Raydell Maners L 12/01/2024, 9:16 PM
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
## 2024-12-01 NOTE — ED Triage Notes (Signed)
 Patient BIB [facility] from Home.  Notes allergic reaction that began 1 hour ago when she ate some oranges. 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 Began to feel some throat swelling and SOB shortly afterwards.   EMS VS stable: 110/90 BP, 97 HR, 97 O2. No EMS interventions.  NAD noted during triage. A&Ox4. Gcs 15. Ambulatory.

## 2024-12-01 NOTE — ED Provider Notes (Signed)
 [facility] Provider Note   CSN: [id number] Arrival date & time: 12/01/24  0046     History Chief Complaint  Patient presents with   Allergic Reaction    HPI Betty Davis is a 18 y.o. female presenting for chief complaint of allergic reaction.  Endorses oral swelling and pain after drinking a citrus beverage this evening.  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 Substantial oropharyngeal swelling.  Took Benadryl  prior to arrival with moderate improvement still some ongoing pain and burning.    Patient's recorded medical, surgical, social, medication list and allergies were reviewed in the Snapshot window as part of the initial history.   Review of Systems   Review of Systems  Constitutional:  Negative for chills and fever.  HENT:  Negative for ear pain and sore throat.   Eyes:  Negative for pain and visual disturbance.  Respiratory:  Negative for cough and shortness of breath.   Cardiovascular:  Negative for chest pain and palpitations.  Gastrointestinal:  Negative for abdominal pain and vomiting.  Genitourinary:  Negative for dysuria and hematuria.  Musculoskeletal:  Negative for arthralgias and back pain.  Skin:  Negative for color change and rash.  Neurological:  Negative for seizures and syncope.  All other systems reviewed and are negative.   Physical Exam Updated Vital Signs BP 121/72 (BP Location: Right Arm)   Pulse 97   Temp 98.4 F (36.9 C) (Oral)   Resp (!) 22   Ht 5' 4 (1.626 m)   Wt 54.4 kg   LMP 11/02/2024 Comment: waiver signed  SpO2 100%   BMI 20.60 kg/m  Physical Exam Constitutional:      General: She is not in acute distress.    Appearance: She is not ill-appearing or toxic-appearing.  HENT:     Head: Normocephalic and atraumatic.  Eyes:     Extraocular Movements: Extraocular movements intact.     Pupils: Pupils are equal, round, and reactive to light.  Cardiovascular:     Rate and  Rhythm: Normal rate.  Pulmonary:     Effort: No respiratory distress.  Abdominal:     General: Abdomen is flat.  Musculoskeletal:        General: No swelling, deformity or signs of injury.     Cervical back: Normal range of motion. No rigidity.  Skin:    General: Skin is warm and dry.  Neurological:     General: No focal deficit present.     Mental Status: She is alert and oriented to person, place, and time.  Psychiatric:        Mood and Affect: Mood normal.      ED Course/ Medical Decision Making/ A&P    Procedures Procedures   Medications Ordered in ED Medications  diphenhydrAMINE  (BENADRYL ) 50 MG/ML injection (50 mg  Given During Downtime 12/01/24 0115)   Medical Decision Making:   [name] is a 18 y.o. female who presented to the ED today with oral irritation detailed above.    Additional history discussed with patient's family/caregivers.  Complete initial physical exam performed, notably the patient  was HDS in NAD.    Reviewed and confirmed nursing documentation for past medical history, family history, social history.    Initial Assessment/plan:   Patient's constellation of symptoms above are most consistent with an allergic reaction. Considered anaphylactic reaction less likely given only single system of involvement and localized symptoms.  This is most consistent with an acute complicated  illness  Initial Plan:  Symptomatic management with histamine blockade including diphenhydramine . Patient will be observed for progression of disease despite administration of medication.  Reassessment and Plan:   After observation window, patient is grossly improved. Discussed ongoing supportive care and management with medications in the outpatient setting and patient feels comfortable with discharge.    Clinical Impression:  1. Allergic reaction, initial encounter      Discharge   Final Clinical Impression(s) / ED Diagnoses Final diagnoses:  Allergic  reaction, initial encounter    Rx / DC Orders ED Discharge Orders     None         Jerral Meth, MD 12/01/24 [phone]

## 2024-12-01 NOTE — Tx Team (Signed)
 Initial Treatment Plan 12/01/2024 5:35 PM Betty Davis FMW:[id number]    PATIENT STRESSORS: Other: School stress     PATIENT STRENGTHS: Capable of independent living  Motivation for treatment/growth    PATIENT IDENTIFIED PROBLEMS:   SI    Stressed out    I don't need to be here           DISCHARGE CRITERIA:  Medical problems require only outpatient monitoring  PRELIMINARY DISCHARGE PLAN: Outpatient therapy  PATIENT/FAMILY INVOLVEMENT: This treatment plan has been presented to and reviewed with the patient, Betty Davis. The patient has been given the opportunity to ask questions and make suggestions.  Shanda BIRCH Alicha Raspberry, RN 12/01/2024, 5:35 PM

## 2024-12-01 NOTE — ED Provider Notes (Signed)
 Accepted to [facility], bed ready, patient stable and signed voluntary consent per RN   Cottie Donnice PARAS, MD 12/01/24 [phone]
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
## 2024-12-01 NOTE — ED Notes (Signed)
 Report given to [facility] RN, Mercie

## 2024-12-02 ENCOUNTER — Ambulatory Visit: Payer: PRIVATE HEALTH INSURANCE

## 2024-12-02 DIAGNOSIS — F4310 Post-traumatic stress disorder, unspecified: Secondary | ICD-10-CM

## 2024-12-02 DIAGNOSIS — F332 Major depressive disorder, recurrent severe without psychotic features: Principal | ICD-10-CM

## 2024-12-02 MED ORDER — WHITE PETROLATUM EX OINT
TOPICAL_OINTMENT | CUTANEOUS | Status: AC
  Filled 2024-12-02: qty 5

## 2024-12-02 NOTE — Group Note (Signed)
 Date:  12/02/2024 Time:  10:22 AM  Group Topic/Focus: recreational therapy played finished the lyric  Guess the Lyrics can be a fun and effective tool for mental health and critical thinking by engaging individuals with emotional content, boosting memory, and fostering empathy. By guessing missing lyrics from songs that address themes like resilience, love, or struggle, participants can connect with their own emotions, reflect on their personal experiences, and practice cognitive flexibility. Discussing the meaning of the lyrics afterward encourages deeper self-awareness and perspective-taking, promoting mental well-being. This activity also offers a creative outlet for self-expression and can serve as a conversation starter on important topics like vulnerability, hope, and personal growth.    Participation Level:  Did Not Attend   Dolores CHRISTELLA Fredericks 12/02/2024, 10:22 AM

## 2024-12-02 NOTE — Progress Notes (Signed)
°   12/02/24 0700  Charting Type  Charting Type Reassessment  Focused Reassessment No Changes Musculoskeletal  Musculoskeletal  Musculoskeletal (WDL) X  Assistive Device None  Weight Bearing Restrictions Per Provider Order Yes  Musculoskeletal Details  Lower Back Limited movement;Other (Comment) (On mobility restrictions, denies pain, no swelling or obvious deformity noted)

## 2024-12-02 NOTE — BHH Suicide Risk Assessment (Signed)
 [facility] Admission and Discharge Suicide Risk Assessment   The patient presented voluntarily to [facility] due to reports of expressed SI with plan, which patient denied expressing upon arrival - stating she had only expressed prior thoughts. Collateral collected from mom corroborates overall stable mood and no concern for SI - both reporting that the patient had been in the hospital for an allergic reaction. She does carry chronic risk factors including young age, history of depression, history of suicide attempts (reports one prior attempt at age 18) and difficulty coping. These are mitigated by numerous protective factors including being domiciled, support of family, in school full time (nursing), future oriented, medication compliant and attending bi-weekly therapy. Currently presenting as euthymic and denying SI. At this time current and short term risk of suicide is low.   Patient is requesting to leave and does not meet criteria for involuntary hold. She will be discharged home into care of her family and recommend following up with established provider   I certify that inpatient services furnished can reasonably be expected to improve the patient's condition.   Leita LOISE Arts, MD 12/02/2024, 11:34 AM
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
## 2024-12-02 NOTE — Group Note (Signed)
 Recreation Therapy Group Note   Group Topic:Leisure Education  Group Date: 12/02/2024 Start Time: 0930 End Time: 1002 Facilitators: Khush Pasion-McCall, LRT,CTRS Location: [address] Dayroom   Group Topic: Leisure Education   Goal Area(s) Addresses:  Patient will successfully identify positive leisure and recreation activities.  Patient will acknowledge benefits of participation in healthy leisure activities post discharge.  Patient will actively work with peers toward a shared goal.   Behavioral Response:    Intervention: Competitive Group Game   Activity: Guess the [employer]. The game is divided into 6 categories (Pop, R&B, Rock, Hip Hop, Dance and Indie). In teams of 3-4, patients took turns spinning the wheel. Whatever category the needle landed on, the group would pull a card from that stack. One of group members reads the lyric to their group. Their group has to guess the missing lyric. If they get the correct answer, they keep the card. If not, the other team gets the chance to steal the point. The team with the most cards at the end wins the game.   Education: Teacher, English As A Foreign Language, Stress Management, Discharge Planning  Education Outcome: Acknowledges education/In group clarification offered/Needs additional education   Affect/Mood: N/A   Participation Level: Did not attend    Clinical Observations/Individualized Feedback:      Plan: Continue to engage patient in RT group sessions 2-3x/week.   Ojani Berenson-McCall, LRT,CTRS 12/02/2024 11:33 AM

## 2024-12-02 NOTE — Progress Notes (Signed)
°  [facility] Adult Case Management Discharge Plan :  Will you be returning to the same living situation after discharge:  No. Patient will be residing with her mother in [place], [place].  At discharge, do you have transportation home?: Yes,  patient's mother, Betty Davis, will be providing transportation at 1pm.  Do you have the ability to pay for your medications: Yes,  patient has active health insurance.   Release of information consent forms completed and in the chart;  Patient's signature needed at discharge.  Patient to Follow up at:  Follow-up Information     [facility] Follow up on 12/02/2024.   Why: You have an appointment with this provider for medication management services on 12/02/25 at 2:00 pm. Contact information: [address] [place] [address] [phone]         [facility] Follow up.   Why: Please call your provider to schedule your next therapy appointment. Contact information: [address], [place], [place] [address] Phone: [phone]                Next level of care provider has access to [facility] Link:no  Safety Planning and Suicide Prevention discussed: Yes,  completed with Betty Davis (mother) [phone].      Has patient been referred to the Quitline?: Patient does not use tobacco/nicotine products  Patient has been referred for addiction treatment: No known substance use disorder.  Betty Davis, LCSWA 12/02/2024, 12:11 PM
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
## 2024-12-02 NOTE — Progress Notes (Signed)
 Patient verbalizes readiness for discharge. All patient belongings returned to patient. Discharge instructions read and discussed with patient (appointments, medications, resources). Patient expressed gratitude for care provided. Patient discharged to lobby at 1315 where her mother was waiting.

## 2024-12-02 NOTE — Progress Notes (Signed)
(  Sleep Hours) -5.75 as of 0530 (Any PRNs that were needed, meds refused, or side effects to meds)- none (Any disturbances and when (visitation, over night)-none (Concerns raised by the patient)- none (SI/HI/AVH)- denies all
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
## 2024-12-02 NOTE — Group Note (Signed)
 Date:  12/02/2024 Time:  12:58 PM  Group Topic/Focus: Social wellness Wellness Toolbox:   The focus of this group is to discuss various aspects of wellness, balancing those aspects and exploring ways to increase the ability to experience wellness.  Patients will create a wellness toolbox for use upon discharge.    Participation Level:  Minimal   Additional Comments:  Came in towards the end  [employer] 12/02/2024, 12:58 PM

## 2024-12-02 NOTE — Group Note (Signed)
 Date:  12/02/2024 Time:  10:02 AM  Group Topic/Focus: Self assessment orientation group Goals Group:   The focus of this group is to help patients establish daily goals to achieve during treatment and discuss how the patient can incorporate goal setting into their daily lives to aide in recovery. Self Care:   The focus of this group is to help patients understand the importance of self-care in order to improve or restore emotional, physical, spiritual, interpersonal, and financial health.    Participation Level:  Did Not Attend   Dolores CHRISTELLA Fredericks 12/02/2024, 10:02 AM
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
## 2024-12-20 ENCOUNTER — Other Ambulatory Visit (HOSPITAL_COMMUNITY): Payer: Self-pay

## 2024-12-21 ENCOUNTER — Other Ambulatory Visit: Payer: Self-pay

## 2024-12-21 ENCOUNTER — Other Ambulatory Visit (HOSPITAL_COMMUNITY): Payer: Self-pay

## 2024-12-24 ENCOUNTER — Other Ambulatory Visit (HOSPITAL_COMMUNITY): Payer: Self-pay

## 2024-12-27 ENCOUNTER — Other Ambulatory Visit (HOSPITAL_COMMUNITY): Payer: Self-pay

## 2024-12-29 ENCOUNTER — Other Ambulatory Visit (HOSPITAL_COMMUNITY): Payer: Self-pay

## 2024-12-29 ENCOUNTER — Other Ambulatory Visit: Payer: Self-pay

## 2024-12-29 MED ORDER — PRAZOSIN HCL 1 MG PO CAPS
1.0000 mg | ORAL_CAPSULE | Freq: Every evening | ORAL | 1 refills | Status: AC | PRN
  Filled 2024-12-29: qty 30, 30d supply, fill #0

## 2025-01-10 ENCOUNTER — Other Ambulatory Visit: Payer: Self-pay

## 2025-01-10 ENCOUNTER — Other Ambulatory Visit (HOSPITAL_COMMUNITY): Payer: Self-pay

## 2025-01-10 MED ORDER — ESCITALOPRAM OXALATE 10 MG PO TABS
10.0000 mg | ORAL_TABLET | Freq: Every day | ORAL | 1 refills | Status: AC
  Filled 2025-01-10 – 2025-01-23 (×2): qty 30, 30d supply, fill #0

## 2025-01-10 MED ORDER — HYDROXYZINE HCL 25 MG PO TABS
25.0000 mg | ORAL_TABLET | Freq: Two times a day (BID) | ORAL | 3 refills | Status: AC
  Filled 2025-01-10: qty 60, 30d supply, fill #0

## 2025-01-10 MED ORDER — PRAZOSIN HCL 2 MG PO CAPS
2.0000 mg | ORAL_CAPSULE | Freq: Every day | ORAL | 1 refills | Status: AC
  Filled 2025-01-10: qty 30, 30d supply, fill #0

## 2025-01-10 MED ORDER — ATOMOXETINE HCL 40 MG PO CAPS
40.0000 mg | ORAL_CAPSULE | Freq: Every day | ORAL | 4 refills | Status: AC
  Filled 2025-01-10: qty 30, 30d supply, fill #0

## 2025-01-11 ENCOUNTER — Other Ambulatory Visit (HOSPITAL_COMMUNITY): Payer: Self-pay

## 2025-01-13 ENCOUNTER — Other Ambulatory Visit (HOSPITAL_COMMUNITY): Payer: Self-pay

## 2025-01-23 ENCOUNTER — Other Ambulatory Visit (HOSPITAL_COMMUNITY): Payer: Self-pay
